# Patient Record
Sex: Female | Born: 1935
Health system: Southern US, Community
[De-identification: ages and names within clinical notes are randomized; demographics above are authoritative.]

## PROBLEM LIST (undated history)

## (undated) DIAGNOSIS — I10 Essential (primary) hypertension: Secondary | ICD-10-CM

## (undated) DIAGNOSIS — F32A Depression, unspecified: Secondary | ICD-10-CM

## (undated) DIAGNOSIS — K920 Hematemesis: Secondary | ICD-10-CM

## (undated) DIAGNOSIS — R609 Edema, unspecified: Secondary | ICD-10-CM

## (undated) DIAGNOSIS — E871 Hypo-osmolality and hyponatremia: Secondary | ICD-10-CM

## (undated) DIAGNOSIS — I499 Cardiac arrhythmia, unspecified: Secondary | ICD-10-CM

## (undated) DIAGNOSIS — F329 Major depressive disorder, single episode, unspecified: Secondary | ICD-10-CM

## (undated) DIAGNOSIS — T8859XA Other complications of anesthesia, initial encounter: Secondary | ICD-10-CM

## (undated) DIAGNOSIS — E039 Hypothyroidism, unspecified: Secondary | ICD-10-CM

## (undated) DIAGNOSIS — I82409 Acute embolism and thrombosis of unspecified deep veins of unspecified lower extremity: Secondary | ICD-10-CM

## (undated) DIAGNOSIS — M1990 Unspecified osteoarthritis, unspecified site: Secondary | ICD-10-CM

## (undated) DIAGNOSIS — F419 Anxiety disorder, unspecified: Secondary | ICD-10-CM

## (undated) DIAGNOSIS — K219 Gastro-esophageal reflux disease without esophagitis: Secondary | ICD-10-CM

## (undated) DIAGNOSIS — R002 Palpitations: Secondary | ICD-10-CM

## (undated) DIAGNOSIS — E785 Hyperlipidemia, unspecified: Secondary | ICD-10-CM

## (undated) DIAGNOSIS — R011 Cardiac murmur, unspecified: Secondary | ICD-10-CM

## (undated) DIAGNOSIS — N179 Acute kidney failure, unspecified: Secondary | ICD-10-CM

## (undated) DIAGNOSIS — I639 Cerebral infarction, unspecified: Secondary | ICD-10-CM

## (undated) DIAGNOSIS — T4145XA Adverse effect of unspecified anesthetic, initial encounter: Secondary | ICD-10-CM

## (undated) HISTORY — DX: Gastro-esophageal reflux disease without esophagitis: K21.9

## (undated) HISTORY — DX: Hypo-osmolality and hyponatremia: E87.1

## (undated) HISTORY — DX: Hematemesis: K92.0

## (undated) HISTORY — PX: CARDIAC CATHETERIZATION: SHX172

## (undated) HISTORY — DX: Acute embolism and thrombosis of unspecified deep veins of unspecified lower extremity: I82.409

## (undated) HISTORY — PX: CHOLECYSTECTOMY: SHX55

## (undated) HISTORY — DX: Hyperlipidemia, unspecified: E78.5

## (undated) HISTORY — PX: NECK SURGERY: SHX720

## (undated) HISTORY — PX: BELPHAROPTOSIS REPAIR: SHX369

## (undated) HISTORY — DX: Anxiety disorder, unspecified: F41.9

## (undated) HISTORY — DX: Acute kidney failure, unspecified: N17.9

## (undated) HISTORY — PX: TUBAL LIGATION: SHX77

## (undated) HISTORY — DX: Essential (primary) hypertension: I10

---

## 2002-07-03 HISTORY — PX: BREAST BIOPSY: SHX20

## 2004-06-14 ENCOUNTER — Ambulatory Visit: Payer: Self-pay | Admitting: Surgery

## 2005-06-29 ENCOUNTER — Ambulatory Visit: Payer: Self-pay | Admitting: Internal Medicine

## 2005-07-05 ENCOUNTER — Ambulatory Visit: Payer: Self-pay | Admitting: Gastroenterology

## 2006-07-09 ENCOUNTER — Ambulatory Visit: Payer: Self-pay | Admitting: Internal Medicine

## 2007-08-22 ENCOUNTER — Ambulatory Visit: Payer: Self-pay | Admitting: Internal Medicine

## 2007-09-05 ENCOUNTER — Ambulatory Visit: Payer: Self-pay | Admitting: Internal Medicine

## 2008-09-08 ENCOUNTER — Ambulatory Visit: Payer: Self-pay | Admitting: Internal Medicine

## 2008-11-30 ENCOUNTER — Ambulatory Visit: Payer: Self-pay | Admitting: Otolaryngology

## 2008-12-03 ENCOUNTER — Ambulatory Visit: Payer: Self-pay | Admitting: Otolaryngology

## 2009-01-07 ENCOUNTER — Ambulatory Visit: Payer: Self-pay | Admitting: Otolaryngology

## 2009-09-23 ENCOUNTER — Ambulatory Visit: Payer: Self-pay | Admitting: Internal Medicine

## 2010-09-29 ENCOUNTER — Ambulatory Visit: Payer: Self-pay | Admitting: Internal Medicine

## 2011-10-05 ENCOUNTER — Ambulatory Visit: Payer: Self-pay | Admitting: Internal Medicine

## 2012-04-10 ENCOUNTER — Ambulatory Visit: Payer: Self-pay | Admitting: Internal Medicine

## 2012-08-29 ENCOUNTER — Ambulatory Visit: Payer: Self-pay | Admitting: Emergency Medicine

## 2013-02-27 ENCOUNTER — Ambulatory Visit: Payer: Self-pay | Admitting: Internal Medicine

## 2014-03-12 ENCOUNTER — Ambulatory Visit: Payer: Self-pay | Admitting: Internal Medicine

## 2015-02-15 ENCOUNTER — Other Ambulatory Visit: Payer: Self-pay | Admitting: Internal Medicine

## 2015-02-15 DIAGNOSIS — Z1231 Encounter for screening mammogram for malignant neoplasm of breast: Secondary | ICD-10-CM

## 2015-03-15 ENCOUNTER — Ambulatory Visit
Admission: RE | Admit: 2015-03-15 | Discharge: 2015-03-15 | Disposition: A | Discharge status: Home / Self Care | Payer: Medicare Other | Source: Ambulatory Visit | Attending: Internal Medicine | Admitting: Internal Medicine

## 2015-03-15 ENCOUNTER — Other Ambulatory Visit: Payer: Self-pay | Admitting: Internal Medicine

## 2015-03-15 DIAGNOSIS — Z1231 Encounter for screening mammogram for malignant neoplasm of breast: Secondary | ICD-10-CM | POA: Insufficient documentation

## 2015-04-05 ENCOUNTER — Encounter: Payer: Self-pay | Admitting: *Deleted

## 2015-04-08 ENCOUNTER — Ambulatory Visit
Admission: RE | Admit: 2015-04-08 | Discharge: 2015-04-08 | Disposition: A | Discharge status: Home / Self Care | Payer: Medicare Other | Source: Ambulatory Visit | Attending: Ophthalmology | Admitting: Ophthalmology

## 2015-04-08 ENCOUNTER — Ambulatory Visit: Payer: Medicare Other | Admitting: Anesthesiology

## 2015-04-08 ENCOUNTER — Encounter
Admission: RE | Disposition: A | Discharge status: Home / Self Care | Payer: Self-pay | Source: Ambulatory Visit | Attending: Ophthalmology

## 2015-04-08 ENCOUNTER — Encounter: Payer: Self-pay | Admitting: *Deleted

## 2015-04-08 DIAGNOSIS — Z7982 Long term (current) use of aspirin: Secondary | ICD-10-CM | POA: Diagnosis not present

## 2015-04-08 DIAGNOSIS — E7800 Pure hypercholesterolemia, unspecified: Secondary | ICD-10-CM | POA: Diagnosis not present

## 2015-04-08 DIAGNOSIS — Z9049 Acquired absence of other specified parts of digestive tract: Secondary | ICD-10-CM | POA: Diagnosis not present

## 2015-04-08 DIAGNOSIS — M7989 Other specified soft tissue disorders: Secondary | ICD-10-CM | POA: Diagnosis not present

## 2015-04-08 DIAGNOSIS — I499 Cardiac arrhythmia, unspecified: Secondary | ICD-10-CM | POA: Diagnosis not present

## 2015-04-08 DIAGNOSIS — Z79899 Other long term (current) drug therapy: Secondary | ICD-10-CM | POA: Diagnosis not present

## 2015-04-08 DIAGNOSIS — R011 Cardiac murmur, unspecified: Secondary | ICD-10-CM | POA: Insufficient documentation

## 2015-04-08 DIAGNOSIS — F329 Major depressive disorder, single episode, unspecified: Secondary | ICD-10-CM | POA: Insufficient documentation

## 2015-04-08 DIAGNOSIS — I10 Essential (primary) hypertension: Secondary | ICD-10-CM | POA: Insufficient documentation

## 2015-04-08 DIAGNOSIS — E079 Disorder of thyroid, unspecified: Secondary | ICD-10-CM | POA: Insufficient documentation

## 2015-04-08 DIAGNOSIS — R002 Palpitations: Secondary | ICD-10-CM | POA: Diagnosis not present

## 2015-04-08 DIAGNOSIS — Z880 Allergy status to penicillin: Secondary | ICD-10-CM | POA: Insufficient documentation

## 2015-04-08 DIAGNOSIS — H2512 Age-related nuclear cataract, left eye: Secondary | ICD-10-CM | POA: Diagnosis present

## 2015-04-08 DIAGNOSIS — Z8673 Personal history of transient ischemic attack (TIA), and cerebral infarction without residual deficits: Secondary | ICD-10-CM | POA: Diagnosis not present

## 2015-04-08 HISTORY — DX: Depression, unspecified: F32.A

## 2015-04-08 HISTORY — DX: Cerebral infarction, unspecified: I63.9

## 2015-04-08 HISTORY — DX: Edema, unspecified: R60.9

## 2015-04-08 HISTORY — DX: Unspecified osteoarthritis, unspecified site: M19.90

## 2015-04-08 HISTORY — DX: Palpitations: R00.2

## 2015-04-08 HISTORY — DX: Major depressive disorder, single episode, unspecified: F32.9

## 2015-04-08 HISTORY — DX: Hypothyroidism, unspecified: E03.9

## 2015-04-08 HISTORY — PX: CATARACT EXTRACTION W/PHACO: SHX586

## 2015-04-08 HISTORY — DX: Cardiac arrhythmia, unspecified: I49.9

## 2015-04-08 HISTORY — DX: Other complications of anesthesia, initial encounter: T88.59XA

## 2015-04-08 HISTORY — DX: Cardiac murmur, unspecified: R01.1

## 2015-04-08 HISTORY — DX: Essential (primary) hypertension: I10

## 2015-04-08 HISTORY — DX: Adverse effect of unspecified anesthetic, initial encounter: T41.45XA

## 2015-04-08 SURGERY — PHACOEMULSIFICATION, CATARACT, WITH IOL INSERTION
Anesthesia: Monitor Anesthesia Care | Site: Eye | Laterality: Left | Wound class: Clean

## 2015-04-08 MED ORDER — ARMC OPHTHALMIC DILATING GEL
1.0000 "application " | OPHTHALMIC | Status: AC | PRN
  Administered 2015-04-08 (×2): 1 via OPHTHALMIC

## 2015-04-08 MED ORDER — MOXIFLOXACIN HCL 0.5 % OP SOLN: 1.0000 [drp] | OPHTHALMIC | Status: DC | PRN

## 2015-04-08 MED ORDER — MOXIFLOXACIN HCL 0.5 % OP SOLN
OPHTHALMIC | Status: DC | PRN
  Administered 2015-04-08: 8 [drp]

## 2015-04-08 MED ORDER — TETRACAINE HCL 0.5 % OP SOLN
1.0000 [drp] | OPHTHALMIC | Status: AC | PRN
  Administered 2015-04-08: 1 [drp] via OPHTHALMIC

## 2015-04-08 MED ORDER — POVIDONE-IODINE 5 % OP SOLN
1.0000 "application " | OPHTHALMIC | Status: AC | PRN
  Administered 2015-04-08: 1 via OPHTHALMIC

## 2015-04-08 MED ORDER — LACTATED RINGERS IV SOLN
INTRAVENOUS | Status: DC | PRN
  Administered 2015-04-08: 09:00:00 via INTRAVENOUS

## 2015-04-08 MED ORDER — EPINEPHRINE HCL 1 MG/ML IJ SOLN
INTRAMUSCULAR | Status: DC | PRN
  Administered 2015-04-08: 200 mL via OPHTHALMIC

## 2015-04-08 MED ORDER — NEOMYCIN-POLYMYXIN-DEXAMETH 0.1 % OP OINT
TOPICAL_OINTMENT | OPHTHALMIC | Status: DC | PRN
  Administered 2015-04-08: 1 via OPHTHALMIC

## 2015-04-08 MED ORDER — FENTANYL CITRATE (PF) 100 MCG/2ML IJ SOLN
INTRAMUSCULAR | Status: DC | PRN
  Administered 2015-04-08 (×2): 25 ug via INTRAVENOUS

## 2015-04-08 MED ORDER — CARBACHOL 0.01 % IO SOLN
INTRAOCULAR | Status: DC | PRN
  Administered 2015-04-08: 0.5 mL via INTRAOCULAR

## 2015-04-08 MED ORDER — NA HYALUR & NA CHOND-NA HYALUR 0.4-0.35 ML IO KIT
PACK | INTRAOCULAR | Status: DC | PRN
  Administered 2015-04-08: .35 mL via INTRAOCULAR

## 2015-04-08 MED ORDER — SODIUM CHLORIDE 0.9 % IV SOLN
INTRAVENOUS | Status: DC
  Administered 2015-04-08: 07:00:00 via INTRAVENOUS

## 2015-04-08 SURGICAL SUPPLY — 23 items
CANNULA ANT/CHMB 27GA (MISCELLANEOUS) ×3 IMPLANT
CARTRIDGE ABBOTT (MISCELLANEOUS) ×3 IMPLANT
CUP MEDICINE 2OZ PLAST GRAD ST (MISCELLANEOUS) ×3 IMPLANT
GLOVE BIO SURGEON STRL SZ8 (GLOVE) ×3 IMPLANT
GLOVE BIOGEL M 6.5 STRL (GLOVE) ×3 IMPLANT
GLOVE SURG LX 7.5 STRW (GLOVE) ×2
GLOVE SURG LX STRL 7.5 STRW (GLOVE) ×1 IMPLANT
GOWN STRL REUS W/ TWL LRG LVL3 (GOWN DISPOSABLE) ×2 IMPLANT
GOWN STRL REUS W/TWL LRG LVL3 (GOWN DISPOSABLE) ×4
LENS IOL TECNIS 22.0 (Intraocular Lens) ×3 IMPLANT
LENS IOL TECNIS MONO 1P 22.0 (Intraocular Lens) ×1 IMPLANT
PACK CATARACT (MISCELLANEOUS) ×3 IMPLANT
PACK CATARACT BRASINGTON LX (MISCELLANEOUS) ×3 IMPLANT
PACK EYE AFTER SURG (MISCELLANEOUS) ×3 IMPLANT
SOL BSS BAG (MISCELLANEOUS) ×3
SOL PREP PVP 2OZ (MISCELLANEOUS) ×3
SOLUTION BSS BAG (MISCELLANEOUS) ×1 IMPLANT
SOLUTION PREP PVP 2OZ (MISCELLANEOUS) ×1 IMPLANT
SYR 3ML LL SCALE MARK (SYRINGE) ×3 IMPLANT
SYR 5ML LL (SYRINGE) ×3 IMPLANT
SYR TB 1ML 27GX1/2 LL (SYRINGE) ×3 IMPLANT
WATER STERILE IRR 1000ML POUR (IV SOLUTION) ×3 IMPLANT
WIPE NON LINTING 3.25X3.25 (MISCELLANEOUS) ×3 IMPLANT

## 2015-04-08 NOTE — Anesthesia Postprocedure Evaluation (Signed)
  Anesthesia Post-op Note  Patient: Amy Roth  Procedure(s) Performed: Procedure(s) with comments: CATARACT EXTRACTION PHACO AND INTRAOCULAR LENS PLACEMENT (IOC) (Left) - US01:11.3 AP15.9 WKG88.11 FLUID LOT# [id number]  Anesthesia type:MAC  Patient location: PACU  Post pain: Pain level controlled  Post assessment: Post-op Vital signs reviewed, Patient's Cardiovascular Status Stable, Respiratory Function Stable, Patent Airway and No signs of Nausea or vomiting  Post vital signs: Reviewed and stable  Last Vitals:  Filed Vitals:   04/08/15 0638  Pulse: 55  Temp: 36.3 C  Resp: 14    Level of consciousness: awake, alert  and patient cooperative  Complications: No apparent anesthesia complications

## 2015-04-08 NOTE — Anesthesia Preprocedure Evaluation (Signed)
Anesthesia Evaluation  Patient identified by MRN, date of birth, ID band Patient awake    Reviewed: Allergy & Precautions, H&P , NPO status , Patient's Chart, lab work & pertinent test results, reviewed documented beta blocker date and time   History of Anesthesia Complications (+) history of anesthetic complications  Airway Mallampati: II  TM Distance: >3 FB Neck ROM: full    Dental no notable dental hx.    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 No LE sx  CVA, Residual Symptoms negative neurological ROS  negative psych ROS   GI/Hepatic negative GI ROS, Neg liver ROS,   Endo/Other  negative endocrine ROSHypothyroidism   Renal/GU negative Renal ROS  negative genitourinary   Musculoskeletal   Abdominal   Peds  Hematology negative hematology ROS (+)   Anesthesia Other Findings   Reproductive/Obstetrics negative OB ROS                             Anesthesia Physical Anesthesia Plan  ASA: III  Anesthesia Plan: MAC   Post-op Pain Management:    Induction:   Airway Management Planned:   Additional Equipment:   Intra-op Plan:   Post-operative Plan:   Informed Consent: I have reviewed the patients History and Physical, chart, labs and discussed the procedure including the risks, benefits and alternatives for the proposed anesthesia with the patient or authorized representative who has indicated his/her understanding and acceptance.   Dental Advisory Given  Plan Discussed with: CRNA  Anesthesia Plan Comments:         Anesthesia Quick Evaluation

## 2015-04-08 NOTE — Op Note (Signed)
LOCATION:  [place]  PREOPERATIVE DIAGNOSIS:  Nuclear sclerotic cataract left eye.  H25.12  POSTOPERATIVE DIAGNOSIS:  Nuclear sclerotic cataract left eye.   PROCEDURE:  Phacoemulsification with posterior chamber intraocular lens implantation of the left eye.   LENS:  Implant Name Type Inv. Item Serial No. Manufacturer Lot No. LRB No. Used  LENS IMPL INTRAOC ZCB00 22.0 - [id number] Intraocular Lens LENS IMPL INTRAOC ZCB00 22.0 [id number]   Left 1     ULTRASOUND TIME:  16 of 1 minutes 11 seconds, CDE 15.9 SURGEON:  Wyonia Hough, MD   ANESTHESIA:  Retrobulbar block of Xylocaine and Bupivacaine.   COMPLICATIONS:  None.   DESCRIPTION OF PROCEDURE:  The patient was identified in the holding room and transported to the operating room and placed in the supine position under the operating microscope.  The left eye was identified as the operative eye and a retrobulbar block was performed under intravenous sedation.  It was then prepped and draped in the usual sterile ophthalmic fashion.   A 1 millimeter clear-corneal paracentesis was made at the 1:30 position.  The anterior chamber was filled with Viscoat viscoelastic.  A 2.4 millimeter keratome was used to make a near-clear corneal incision at the 10:30 position.  A curvilinear capsulorrhexis was made with a cystotome and capsulorrhexis forceps.  Balanced salt solution was used to hydrodissect and hydrodelineate the nucleus.   Phacoemulsification was then used in stop and chop fashion to remove the lens nucleus and epinucleus.  The remaining cortex was then removed using the irrigation and aspiration handpiece.  Provisc was then placed into the capsular bag to distend it for lens placement.  A 22.0 -diopter lens was then injected into the capsular bag.  The remaining viscoelastic was aspirated.   Wounds were hydrated with balanced salt solution.  The anterior chamber was inflated to a physiologic pressure with balanced  salt solution.  No wound leaks were noted. Vigamox 0.2 ml of a 1mg  per ml solution was injected into the anterior chamber for a dose of 0.2 mg of intracameral antibiotic at the completion of the case.   Timolol and Brimonidine drops were placed followed by erythromycin ointment.  The eye was patched.  The patient was taken to the recovery room in stable condition without complications of anesthesia or surgery.   Amy Roth 04/08/2015, 9:08 AM

## 2015-04-08 NOTE — H&P (Signed)
  The History and Physical notes are on paper, have been signed, and are to be scanned. The patient remains stable and unchanged from the H&P.   Previous H&P reviewed, patient examined, and there are no changes.  Amy Roth 04/08/2015 8:42 AM

## 2015-04-08 NOTE — Transfer of Care (Deleted)
Immediate Anesthesia Transfer of Care Note  Patient: Amy Roth  Procedure(s) Performed: Procedure(s): CATARACT EXTRACTION PHACO AND INTRAOCULAR LENS PLACEMENT (IOC) (Left)  Patient Location: PACU  Anesthesia Type:MAC  Level of Consciousness: awake, alert  and oriented  Airway & Oxygen Therapy: Patient connected to nasal cannula oxygen  Post-op Assessment: Report given to RN and Post -op Vital signs reviewed and stable  Post vital signs: Reviewed and stable  Last Vitals:  Filed Vitals:   04/08/15 0638  Pulse: 55  Temp: 36.3 C  Resp: 14    Complications: No apparent anesthesia complications

## 2015-04-08 NOTE — Anesthesia Postprocedure Evaluation (Signed)
  Anesthesia Post-op Note  Patient: Amy Roth  Procedure(s) Performed: Procedure(s) with comments: CATARACT EXTRACTION PHACO AND INTRAOCULAR LENS PLACEMENT (IOC) (Left) - US01:11.3 AP15.9 JPE16.24 FLUID LOT# [id number]  Anesthesia type:MAC  Patient location: PACU  Post pain: Pain level controlled  Post assessment: Post-op Vital signs reviewed, Patient's Cardiovascular Status Stable, Respiratory Function Stable, Patent Airway and No signs of Nausea or vomiting  Post vital signs: Reviewed and stable  Last Vitals:  Filed Vitals:   04/08/15 0942  BP: 148/62  Pulse: 54  Temp: 35.7 C  Resp: 16    Level of consciousness: awake, alert  and patient cooperative  Complications: No apparent anesthesia complications

## 2015-04-08 NOTE — Discharge Instructions (Addendum)
Eye Surgery Discharge Instructions ? ? ? ?Expect mild scratchy sensation or mild soreness. ?DO NOT RUB YOUR EYE! ? ?The day of surgery: ?Minimal physical activity, but bed rest is not required ?No reading, computer work, or close hand work ?No bending, lifting, or straining. ?May watch TV ? ?For 24 hours: ?No driving, legal decisions, or alcoholic beverages ?Safety precautions ?Eat anything you prefer: It is better to start with liquids, then soup then solid foods. ?_____ Eye patch should be worn until postoperative exam tomorrow. ?__x__ Solar shield eyeglasses should be worn for comfort in the sunlight/patch while sleeping ? ?Resume all regular medications including aspirin or Coumadin if these were discontinued prior to surgery. ?You may shower, bathe, shave, or wash your hair. ?Tylenol may be taken for mild discomfort. ? ?Call your doctor if you experience significant pain, nausea, or vomiting, fever > 101 or other signs of infection. [phone] or [phone] ?Specific instructions: ? ?  ?

## 2015-04-08 NOTE — Transfer of Care (Signed)
Immediate Anesthesia Transfer of Care Note  Patient: Amy Roth  Procedure(s) Performed: Procedure(s) with comments: CATARACT EXTRACTION PHACO AND INTRAOCULAR LENS PLACEMENT (IOC) (Left) - US01:11.3 AP15.9 DCV01.31 FLUID LOT# [id number] H  Patient Location: PACU  Anesthesia Type:MAC  Level of Consciousness: awake, alert  and oriented  Airway & Oxygen Therapy: Patient Spontanous Breathing  Post-op Assessment: Report given to RN  Post vital signs: Reviewed and stable  Last Vitals:  Filed Vitals:   04/08/15 0638  Pulse: 55  Temp: 36.3 C  Resp: 14    Complications: No apparent anesthesia complications

## 2015-05-24 ENCOUNTER — Encounter: Payer: Self-pay | Admitting: *Deleted

## 2015-06-03 ENCOUNTER — Encounter
Admission: RE | Disposition: A | Discharge status: Home / Self Care | Payer: Self-pay | Source: Ambulatory Visit | Attending: Ophthalmology

## 2015-06-03 ENCOUNTER — Ambulatory Visit: Payer: Medicare Other | Admitting: Certified Registered Nurse Anesthetist

## 2015-06-03 ENCOUNTER — Encounter: Payer: Self-pay | Admitting: *Deleted

## 2015-06-03 ENCOUNTER — Ambulatory Visit
Admission: RE | Admit: 2015-06-03 | Discharge: 2015-06-03 | Disposition: A | Discharge status: Home / Self Care | Payer: Medicare Other | Source: Ambulatory Visit | Attending: Ophthalmology | Admitting: Ophthalmology

## 2015-06-03 DIAGNOSIS — E039 Hypothyroidism, unspecified: Secondary | ICD-10-CM | POA: Insufficient documentation

## 2015-06-03 DIAGNOSIS — H2511 Age-related nuclear cataract, right eye: Secondary | ICD-10-CM | POA: Diagnosis present

## 2015-06-03 DIAGNOSIS — I10 Essential (primary) hypertension: Secondary | ICD-10-CM | POA: Diagnosis not present

## 2015-06-03 DIAGNOSIS — Z79899 Other long term (current) drug therapy: Secondary | ICD-10-CM | POA: Insufficient documentation

## 2015-06-03 DIAGNOSIS — Z8673 Personal history of transient ischemic attack (TIA), and cerebral infarction without residual deficits: Secondary | ICD-10-CM | POA: Diagnosis not present

## 2015-06-03 DIAGNOSIS — Z7982 Long term (current) use of aspirin: Secondary | ICD-10-CM | POA: Insufficient documentation

## 2015-06-03 DIAGNOSIS — Z880 Allergy status to penicillin: Secondary | ICD-10-CM | POA: Diagnosis not present

## 2015-06-03 DIAGNOSIS — M1990 Unspecified osteoarthritis, unspecified site: Secondary | ICD-10-CM | POA: Insufficient documentation

## 2015-06-03 DIAGNOSIS — F329 Major depressive disorder, single episode, unspecified: Secondary | ICD-10-CM | POA: Insufficient documentation

## 2015-06-03 HISTORY — PX: CATARACT EXTRACTION W/PHACO: SHX586

## 2015-06-03 SURGERY — PHACOEMULSIFICATION, CATARACT, WITH IOL INSERTION
Anesthesia: Monitor Anesthesia Care | Laterality: Right | Wound class: Clean

## 2015-06-03 MED ORDER — EPINEPHRINE HCL 1 MG/ML IJ SOLN
INTRAOCULAR | Status: DC | PRN
  Administered 2015-06-03: 250 mL via OPHTHALMIC

## 2015-06-03 MED ORDER — NA HYALUR & NA CHOND-NA HYALUR 0.55-0.5 ML IO KIT
PACK | INTRAOCULAR | Status: AC
  Filled 2015-06-03: qty 1.05

## 2015-06-03 MED ORDER — TETRACAINE HCL 0.5 % OP SOLN
1.0000 [drp] | OPHTHALMIC | Status: AC | PRN
  Administered 2015-06-03: 1 [drp] via OPHTHALMIC

## 2015-06-03 MED ORDER — ARMC OPHTHALMIC DILATING GEL
1.0000 "application " | OPHTHALMIC | Status: AC | PRN
  Administered 2015-06-03 (×2): 1 via OPHTHALMIC

## 2015-06-03 MED ORDER — POVIDONE-IODINE 5 % OP SOLN
1.0000 "application " | OPHTHALMIC | Status: AC | PRN
  Administered 2015-06-03: 1 via OPHTHALMIC

## 2015-06-03 MED ORDER — NA HYALUR & NA CHOND-NA HYALUR 0.4-0.35 ML IO KIT
PACK | INTRAOCULAR | Status: DC | PRN
  Administered 2015-06-03: 1 mL via INTRAOCULAR

## 2015-06-03 MED ORDER — SODIUM CHLORIDE 0.9 % IV SOLN
INTRAVENOUS | Status: DC
  Administered 2015-06-03: 07:00:00 via INTRAVENOUS

## 2015-06-03 MED ORDER — CARBACHOL 0.01 % IO SOLN
INTRAOCULAR | Status: DC | PRN
Start: 2015-06-03 — End: 2015-06-03
  Administered 2015-06-03: .5 mL via INTRAOCULAR

## 2015-06-03 MED ORDER — MOXIFLOXACIN HCL 0.5 % OP SOLN: 1.0000 [drp] | OPHTHALMIC | Status: DC | PRN

## 2015-06-03 MED ORDER — EPINEPHRINE HCL 1 MG/ML IJ SOLN
INTRAMUSCULAR | Status: AC
  Filled 2015-06-03: qty 1

## 2015-06-03 MED ORDER — LIDOCAINE HCL (PF) 4 % IJ SOLN: INTRAOCULAR | Status: DC | PRN

## 2015-06-03 MED ORDER — NEOMYCIN-POLYMYXIN-DEXAMETH 3.5-10000-0.1 OP OINT
TOPICAL_OINTMENT | OPHTHALMIC | Status: AC
  Filled 2015-06-03: qty 3.5

## 2015-06-03 MED ORDER — CEFUROXIME OPHTHALMIC INJECTION 1 MG/0.1 ML: INJECTION | OPHTHALMIC | Status: DC | PRN

## 2015-06-03 MED ORDER — NEOMYCIN-POLYMYXIN-DEXAMETH 0.1 % OP OINT
TOPICAL_OINTMENT | OPHTHALMIC | Status: DC | PRN
  Administered 2015-06-03: 1 via OPHTHALMIC

## 2015-06-03 MED ORDER — CEFUROXIME OPHTHALMIC INJECTION 1 MG/0.1 ML
INJECTION | OPHTHALMIC | Status: AC
  Filled 2015-06-03: qty 0.1

## 2015-06-03 SURGICAL SUPPLY — 22 items
CANNULA ANT/CHMB 27GA (MISCELLANEOUS) ×3 IMPLANT
CUP MEDICINE 2OZ PLAST GRAD ST (MISCELLANEOUS) ×3 IMPLANT
GLOVE BIO SURGEON STRL SZ8 (GLOVE) ×3 IMPLANT
GLOVE BIOGEL M 6.5 STRL (GLOVE) ×3 IMPLANT
GLOVE SURG LX 7.5 STRW (GLOVE) ×2
GLOVE SURG LX STRL 7.5 STRW (GLOVE) ×1 IMPLANT
GOWN STRL REUS W/ TWL LRG LVL3 (GOWN DISPOSABLE) ×2 IMPLANT
GOWN STRL REUS W/TWL LRG LVL3 (GOWN DISPOSABLE) ×4
LENS IOL TECNIS 22.5 (Intraocular Lens) ×3 IMPLANT
LENS IOL TECNIS MONO 1P 22.5 (Intraocular Lens) ×1 IMPLANT
PACK CATARACT (MISCELLANEOUS) ×3 IMPLANT
PACK CATARACT BRASINGTON LX (MISCELLANEOUS) ×3 IMPLANT
PACK EYE AFTER SURG (MISCELLANEOUS) ×3 IMPLANT
SOL BSS BAG (MISCELLANEOUS) ×3
SOL PREP PVP 2OZ (MISCELLANEOUS) ×3
SOLUTION BSS BAG (MISCELLANEOUS) ×1 IMPLANT
SOLUTION PREP PVP 2OZ (MISCELLANEOUS) ×1 IMPLANT
SYR 3ML LL SCALE MARK (SYRINGE) ×3 IMPLANT
SYR 5ML LL (SYRINGE) ×3 IMPLANT
SYR TB 1ML 27GX1/2 LL (SYRINGE) ×3 IMPLANT
WATER STERILE IRR 1000ML POUR (IV SOLUTION) ×3 IMPLANT
WIPE NON LINTING 3.25X3.25 (MISCELLANEOUS) ×3 IMPLANT

## 2015-06-03 NOTE — Transfer of Care (Signed)
Immediate Anesthesia Transfer of Care Note  Patient: CHEALSEY STRAIN  Procedure(s) Performed: Procedure(s) with comments: CATARACT EXTRACTION PHACO AND INTRAOCULAR LENS PLACEMENT (IOC) (Right) - [place]  1:12.8 AP   19.3 CDE  13.23 casette lot [id number]  Patient Location: PACU  Anesthesia Type:MAC  Level of Consciousness: awake, alert  and oriented  Airway & Oxygen Therapy: spontaneous no oxygen  Post-op Assessment: Report given to RN  Post vital signs: Reviewed and stable  Last Vitals:  Filed Vitals:   06/03/15 0626 06/03/15 0757  BP: 160/71 118/85  Pulse: 54 74  Temp: 36.7 C   Resp: 16 18    Complications: No apparent anesthesia complications

## 2015-06-03 NOTE — Anesthesia Procedure Notes (Signed)
Procedure Name: MAC Date/Time: 06/03/2015 7:33 AM Performed by: Johnna Acosta Pre-anesthesia Checklist: Patient identified, Emergency Drugs available, Suction available and Patient being monitored Patient Re-evaluated:Patient Re-evaluated prior to inductionOxygen Delivery Method: Nasal cannula

## 2015-06-03 NOTE — Anesthesia Preprocedure Evaluation (Signed)
Anesthesia Evaluation  Patient identified by MRN, date of birth, ID band Patient awake    Reviewed: Allergy & Precautions, H&P , NPO status , Patient's Chart, lab work & pertinent test results, reviewed documented beta blocker date and time   History of Anesthesia Complications (+) history of anesthetic complications  Airway Mallampati: II  TM Distance: >3 FB Neck ROM: full    Dental no notable dental hx.    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 CVA, Residual Symptoms negative neurological ROS  negative psych ROS   GI/Hepatic negative GI ROS, Neg liver ROS,   Endo/Other  negative endocrine ROSHypothyroidism   Renal/GU negative Renal ROS  negative genitourinary   Musculoskeletal   Abdominal   Peds  Hematology negative hematology ROS (+)   Anesthesia Other Findings   Reproductive/Obstetrics negative OB ROS                             Anesthesia Physical Anesthesia Plan  ASA: III  Anesthesia Plan: MAC   Post-op Pain Management:    Induction:   Airway Management Planned:   Additional Equipment:   Intra-op Plan:   Post-operative Plan:   Informed Consent: I have reviewed the patients History and Physical, chart, labs and discussed the procedure including the risks, benefits and alternatives for the proposed anesthesia with the patient or authorized representative who has indicated his/her understanding and acceptance.   Dental Advisory Given  Plan Discussed with: CRNA  Anesthesia Plan Comments:         Anesthesia Quick Evaluation

## 2015-06-03 NOTE — Op Note (Signed)
OPERATIVE NOTE  Amy Roth [id number] 06/03/2015   PREOPERATIVE DIAGNOSIS:  Nuclear Sclerotic Cataract Right Eye H25.11   POSTOPERATIVE DIAGNOSIS: Nuclear Sclerotic Cataract Right Eye H25.11          PROCEDURE:  Phacoemusification with posterior chamber intraocular lens placement of the right eye   LENS:   Implant Name Type Inv. Item Serial No. Manufacturer Lot No. LRB No. Used  LENS IMPL INTRAOC ZCB00 22.5 - [id number] Intraocular Lens LENS IMPL INTRAOC ZCB00 22.5 [id number]   Right 1       ULTRASOUND TIME: 19 %  of 1 minutes 13 seconds, CDE 13.2  SURGEON:  Wyonia Hough, MD   ANESTHESIA:  Topical with tetracaine drops and 2% Xylocaine jelly.   COMPLICATIONS:  None.   DESCRIPTION OF PROCEDURE:  The patient was identified in the holding room and transported to the operating room and placed in the supine position under the operating microscope. Theright eye was identified as the operative eye and it was prepped and draped in the usual sterile ophthalmic fashion.   A 1 millimeter clear-corneal paracentesis was made at the 12:00 position.  The anterior chamber was filled with Viscoat viscoelastic.  A 2.4 millimeter keratome was used to make a near-clear corneal incision at the 9:00 position. A curvilinear capsulorrhexis was made with a cystotome and capsulorrhexis forceps.  Balanced salt solution was used to hydrodissect and hydrodelineate the nucleus.   Phacoemulsification was then used in stop and chop fashion to remove the lens nucleus and epinucleus.  The remaining cortex was then removed using the irrigation and aspiration handpiece. Provisc was then placed into the capsular bag to distend it for lens placement.  A lens was then injected into the capsular bag.  The remaining viscoelastic was aspirated.  Wounds were hydrated with balanced salt solution.  The anterior chamber was inflated to a physiologic pressure with balanced salt solution. Vigamox 0.2 ml of  a 1mg  per ml solution was injected into the anterior chamber for a dose of 0.2 mg of intracameral antibiotic at the completion of the case. Miostat was placed into the anterior chamber to constrict the pupil.  No wound leaks were noted.  Topical Vigamox drops and Maxitrol ointment were applied to the eye.  The patient was taken to the recovery room in stable condition without complications of anesthesia or surgery.  [name] 06/03/2015, 7:52 AM

## 2015-06-03 NOTE — H&P (Signed)
  The History and Physical notes are on paper, have been signed, and are to be scanned. The patient remains stable and unchanged from the H&P.   Previous H&P reviewed, patient examined, and there are no changes.  Amy Roth 06/03/2015 7:25 AM

## 2015-06-04 NOTE — Anesthesia Postprocedure Evaluation (Signed)
Anesthesia Post Note  Patient: Amy Roth  Procedure(s) Performed: Procedure(s) (LRB): CATARACT EXTRACTION PHACO AND INTRAOCULAR LENS PLACEMENT (IOC) (Right)  Patient location during evaluation: PACU Anesthesia Type: MAC Level of consciousness: awake and alert Pain management: pain level controlled Vital Signs Assessment: post-procedure vital signs reviewed and stable Respiratory status: spontaneous breathing, nonlabored ventilation, respiratory function stable and patient connected to nasal cannula oxygen Cardiovascular status: stable and blood pressure returned to baseline Anesthetic complications: no    Last Vitals:  Filed Vitals:   06/03/15 0801 06/03/15 0806  BP:  147/76  Pulse: 74 56  Temp: 35.9 C   Resp: 16     Last Pain:  Filed Vitals:   06/04/15 0837  PainSc: 0-No pain                 Molli Barrows

## 2016-04-18 ENCOUNTER — Other Ambulatory Visit: Payer: Self-pay | Admitting: Internal Medicine

## 2016-04-18 DIAGNOSIS — Z1231 Encounter for screening mammogram for malignant neoplasm of breast: Secondary | ICD-10-CM

## 2016-05-24 ENCOUNTER — Ambulatory Visit
Admission: RE | Admit: 2016-05-24 | Discharge: 2016-05-24 | Disposition: A | Discharge status: Home / Self Care | Payer: Medicare Other | Source: Ambulatory Visit | Attending: Internal Medicine | Admitting: Internal Medicine

## 2016-05-24 DIAGNOSIS — Z1231 Encounter for screening mammogram for malignant neoplasm of breast: Secondary | ICD-10-CM

## 2017-03-22 ENCOUNTER — Other Ambulatory Visit: Payer: Self-pay | Admitting: Internal Medicine

## 2017-03-22 DIAGNOSIS — Z1231 Encounter for screening mammogram for malignant neoplasm of breast: Secondary | ICD-10-CM

## 2017-05-01 ENCOUNTER — Emergency Department: Payer: Medicare Other

## 2017-05-01 ENCOUNTER — Emergency Department
Admission: EM | Admit: 2017-05-01 | Discharge: 2017-05-01 | Disposition: A | Discharge status: Home / Self Care | Payer: Medicare Other | Attending: Emergency Medicine | Admitting: Emergency Medicine

## 2017-05-01 ENCOUNTER — Encounter: Payer: Self-pay | Admitting: Emergency Medicine

## 2017-05-01 DIAGNOSIS — I10 Essential (primary) hypertension: Secondary | ICD-10-CM | POA: Insufficient documentation

## 2017-05-01 DIAGNOSIS — W19XXXA Unspecified fall, initial encounter: Secondary | ICD-10-CM | POA: Insufficient documentation

## 2017-05-01 DIAGNOSIS — Y929 Unspecified place or not applicable: Secondary | ICD-10-CM | POA: Insufficient documentation

## 2017-05-01 DIAGNOSIS — E039 Hypothyroidism, unspecified: Secondary | ICD-10-CM | POA: Diagnosis not present

## 2017-05-01 DIAGNOSIS — S0990XA Unspecified injury of head, initial encounter: Secondary | ICD-10-CM | POA: Diagnosis present

## 2017-05-01 DIAGNOSIS — Y939 Activity, unspecified: Secondary | ICD-10-CM | POA: Insufficient documentation

## 2017-05-01 DIAGNOSIS — Z7982 Long term (current) use of aspirin: Secondary | ICD-10-CM | POA: Diagnosis not present

## 2017-05-01 DIAGNOSIS — Y998 Other external cause status: Secondary | ICD-10-CM | POA: Diagnosis not present

## 2017-05-01 DIAGNOSIS — Z79899 Other long term (current) drug therapy: Secondary | ICD-10-CM | POA: Diagnosis not present

## 2017-05-01 DIAGNOSIS — M25561 Pain in right knee: Secondary | ICD-10-CM | POA: Insufficient documentation

## 2017-05-01 DIAGNOSIS — N390 Urinary tract infection, site not specified: Secondary | ICD-10-CM | POA: Diagnosis not present

## 2017-05-01 DIAGNOSIS — Z8673 Personal history of transient ischemic attack (TIA), and cerebral infarction without residual deficits: Secondary | ICD-10-CM | POA: Insufficient documentation

## 2017-05-01 LAB — URINALYSIS, COMPLETE (UACMP) WITH MICROSCOPIC
BACTERIA UA: NONE SEEN
Bilirubin Urine: NEGATIVE
Glucose, UA: NEGATIVE mg/dL
Hgb urine dipstick: NEGATIVE
KETONES UR: NEGATIVE mg/dL
NITRITE: NEGATIVE
PH: 7 (ref 5.0–8.0)
PROTEIN: NEGATIVE mg/dL
Specific Gravity, Urine: 1.011 (ref 1.005–1.030)

## 2017-05-01 LAB — BASIC METABOLIC PANEL
ANION GAP: 6 (ref 5–15)
BUN: 12 mg/dL (ref 6–20)
CO2: 27 mmol/L (ref 22–32)
Calcium: 9.6 mg/dL (ref 8.9–10.3)
Chloride: 104 mmol/L (ref 101–111)
Creatinine, Ser: 0.99 mg/dL (ref 0.44–1.00)
GFR calc Af Amer: >60 mL/min (ref >60)
GFR calc non Af Amer: 52 mL/min — ABNORMAL LOW (ref >60)
GLUCOSE: 127 mg/dL — AB (ref 65–99)
POTASSIUM: 4.2 mmol/L (ref 3.5–5.1)
Sodium: 137 mmol/L (ref 135–145)

## 2017-05-01 LAB — CBC
HEMATOCRIT: 42.9 % (ref 35.0–47.0)
HEMOGLOBIN: 14.7 g/dL (ref 12.0–16.0)
MCH: 31.3 pg (ref 26.0–34.0)
MCHC: 34.2 g/dL (ref 32.0–36.0)
MCV: 91.5 fL (ref 80.0–100.0)
Platelets: 231 10*3/uL (ref 150–440)
RBC: 4.69 MIL/uL (ref 3.80–5.20)
RDW: 14 % (ref 11.5–14.5)
WBC: 7.2 10*3/uL (ref 3.6–11.0)

## 2017-05-01 LAB — TROPONIN I

## 2017-05-01 MED ORDER — CEPHALEXIN 500 MG PO CAPS
500.0000 mg | ORAL_CAPSULE | Freq: Once | ORAL | Status: AC
  Administered 2017-05-01: 500 mg via ORAL
  Filled 2017-05-01: qty 1

## 2017-05-01 MED ORDER — CEPHALEXIN 500 MG PO CAPS: 500.0000 mg | ORAL_CAPSULE | Freq: Three times a day (TID) | ORAL | 0 refills | Status: DC

## 2017-05-01 NOTE — Discharge Instructions (Signed)
Please seek medical attention for any high fevers, chest pain, shortness of breath, change in behavior, persistent vomiting, bloody stool or any other new or concerning symptoms.  

## 2017-05-01 NOTE — ED Provider Notes (Signed)
[facility] Emergency Department Provider Note   ____________________________________________   I have reviewed the triage vital signs and the nursing notes.   HISTORY  Chief Complaint Weakness, fall, head injury  History limited by: Not Limited   HPI Amy Roth is a 83 y.o. female who presents to the emergency department today because of concern for weakness, fall and head injury.  DURATION:2 days TIMING: fell 2 days ago, somewhat constant weakness since then QUALITY: felt lightheaded CONTEXT: patient states she fell two days ago, has had some weakness since then. [place] again today. Hit her head both times. Unclear LOC.  ASSOCIATED SYMPTOMS: complaining of right knee pain. denies fever, denies shortness of breath. Denies chest pain.  Per medical record review patient has a history of arthritis  Past Medical History:  Diagnosis Date  . Arthritis   . Complication of anesthesia    hard to wake up  . Depression   . Dysrhythmia   . Edema    feet/legs  . Heart murmur   . Hypertension   . Hypothyroidism   . Palpitation   . Stroke [facility])    facial    There are no active problems to display for this patient.   Past Surgical History:  Procedure Laterality Date  . BELPHAROPTOSIS REPAIR    . BREAST BIOPSY Left 2004   benign  . CARDIAC CATHETERIZATION    . CATARACT EXTRACTION W/PHACO Left 04/08/2015   Procedure: CATARACT EXTRACTION PHACO AND INTRAOCULAR LENS PLACEMENT (IOC);  Surgeon: Leandrew Koyanagi, MD;  Location: [facility] ORS;  Service: Ophthalmology;  Laterality: Left;  US01:11.3 AP15.9 CDE11.33 FLUID LOT# [id number] H  . CATARACT EXTRACTION W/PHACO Right 06/03/2015   Procedure: CATARACT EXTRACTION PHACO AND INTRAOCULAR LENS PLACEMENT (IOC);  Surgeon: Leandrew Koyanagi, MD;  Location: [facility] ORS;  Service: Ophthalmology;  Laterality: Right;  [place]  1:12.8 AP   19.3 CDE  13.23 casette lot #[id number]  . CHOLECYSTECTOMY    . NECK SURGERY     . TUBAL LIGATION      Prior to Admission medications   Medication Sig Start Date End Date Taking? Authorizing Provider  amLODipine (NORVASC) 5 MG tablet Take 5 mg by mouth daily.    [provider]  aspirin 81 MG tablet Take 81 mg by mouth daily.    [provider]  benazepril (LOTENSIN) 20 MG tablet Take 20 mg by mouth daily.    [provider]  levothyroxine (SYNTHROID, LEVOTHROID) 50 MCG tablet Take 50 mcg by mouth daily before breakfast.    [provider]  metoprolol succinate (TOPROL-XL) 25 MG 24 hr tablet Take 25 mg by mouth daily.    [provider]  PARoxetine (PAXIL) 20 MG tablet Take 20 mg by mouth at bedtime.    [provider]  simvastatin (ZOCOR) 40 MG tablet Take 40 mg by mouth at bedtime.    [provider]    Allergies Penicillins  History reviewed. No pertinent family history.  Social History Social History  Substance Use Topics  . Smoking status: Never Smoker  . Smokeless tobacco: Never Used  . Alcohol use No    Review of Systems Constitutional: No fever/chills Eyes: No visual changes. ENT: No sore throat. Cardiovascular: Denies chest pain. Respiratory: Denies shortness of breath. Gastrointestinal: No abdominal pain.  No nausea, no vomiting.  No diarrhea.   Genitourinary: Negative for dysuria. Musculoskeletal: Positive for right knee pain. Skin: Negative for rash. Neurological: Negative for headaches, focal weakness or numbness.  ____________________________________________  PHYSICAL EXAM:  VITAL SIGNS: ED Triage Vitals  Enc Vitals Group     BP 05/01/17 1534 (!) 179/85     Pulse Rate 05/01/17 1532 78     Resp 05/01/17 1532 18     Temp 05/01/17 1532 97.8 F (36.6 C)     Temp Source 05/01/17 1532 Oral     SpO2 05/01/17 1532 100 %     Weight 05/01/17 1532 160 lb (72.6 kg)     Height 05/01/17 1532 5\' 1"  (1.549 m)     Head Circumference --      Peak Flow --      Pain Score  05/01/17 1531 0   Constitutional: Alert and oriented. Well appearing and in no distress. Eyes: Conjunctivae are normal.  ENT   Head: Normocephalic and atraumatic.   Nose: No congestion/rhinnorhea.   Mouth/Throat: Mucous membranes are moist.   Neck: No stridor. Hematological/Lymphatic/Immunilogical: No cervical lymphadenopathy. Cardiovascular: Normal rate, regular rhythm.  No murmurs, rubs, or gallops.  Respiratory: Normal respiratory effort without tachypnea nor retractions. Breath sounds are clear and equal bilaterally. No wheezes/rales/rhonchi. Gastrointestinal: Soft and non tender. No rebound. No guarding.  Genitourinary: Deferred Musculoskeletal: Normal range of motion in all extremities. No lower extremity edema. Neurologic:  Normal speech and language. No gross focal neurologic deficits are appreciated.  Skin:  Skin is warm, dry and intact. No rash noted. Psychiatric: Mood and affect are normal. Speech and behavior are normal. Patient exhibits appropriate insight and judgment.  ____________________________________________    LABS (pertinent positives/negatives)  BMP gl 127 k 4.2 CBC wnl UA with 6-30 WBCs, mod leukocytes Trop <0.03  ____________________________________________   EKG  I, Nance Pear, attending physician, personally viewed and interpreted this EKG  EKG Time: 1537 Rate: 79 Rhythm: normal sinus rhythm Axis: left axis devation Intervals: qtc 417 QRS: narrow, q waves V1, III ST changes: no st elevation Impression: abnormal ekg  ____________________________________________    RADIOLOGY  CT head No acute findings  ____________________________________________   PROCEDURES  Procedures  ____________________________________________   INITIAL IMPRESSION / ASSESSMENT AND PLAN / ED COURSE  Pertinent labs & imaging results that were available during my care of the patient were reviewed by me and considered in my medical decision  making (see chart for details).  She presents to the emergency department today with concerns for a week.  Differential would include electrolyte abnormality, infection, anemia amongst other etiologies.  Would also be concerned for head injury given the falls.  Head CT negative for acute process.  Patient's workup is concerning for urinary tract infection which could explain the weakness.  No acute fracture or traumatic injury to the right knee identified on x-ray.  Will plan on treating patient for urinary tract infection.  Discussed return precautions importance of primary care follow-up.   ____________________________________________   FINAL CLINICAL IMPRESSION(S) / ED DIAGNOSES  Final diagnoses:  Injury of head, initial encounter  Acute pain of right knee  Lower urinary tract infection     Note: This dictation was prepared with Dragon dictation. Any transcriptional errors that result from this process are unintentional     Nance Pear, MD 05/01/17 1801

## 2017-05-01 NOTE — ED Notes (Signed)
X-ray at bedside

## 2017-05-01 NOTE — ED Notes (Signed)
Pt has had two falls since Sunday. Reports hitting head twice. No LOC on fall today. Pain to right knee, mild bruising to right knee. Pt alert and oriented X4, active, cooperative, pt in NAD. RR even and unlabored, color WNL.

## 2017-05-01 NOTE — ED Notes (Signed)
Pt alert and oriented X4, active, cooperative, pt in NAD. RR even and unlabored, color WNL.  Discharge and followup instructions reviewed. Pt informed to return if any life threatening symptoms occur.   

## 2017-05-01 NOTE — ED Triage Notes (Signed)
Pt reports fell early Sunday morning walking back to bathroom; got tangled in pajamas.  Hit head but unsure if LOC. No blood thinners.  Today woke up feeling lightheaded and not well.  Tried to take a benadryl to feel better.  Took nap and then fell walking back from mailbox, unsure why fell.  Unsure LOC with fall today.

## 2017-05-02 LAB — URINE CULTURE

## 2017-05-29 ENCOUNTER — Ambulatory Visit
Admission: RE | Admit: 2017-05-29 | Discharge: 2017-05-29 | Disposition: A | Discharge status: Home / Self Care | Payer: Medicare Other | Source: Ambulatory Visit | Attending: Internal Medicine | Admitting: Internal Medicine

## 2017-05-29 DIAGNOSIS — Z1231 Encounter for screening mammogram for malignant neoplasm of breast: Secondary | ICD-10-CM | POA: Insufficient documentation

## 2018-01-06 ENCOUNTER — Other Ambulatory Visit: Payer: Self-pay

## 2018-01-06 ENCOUNTER — Emergency Department
Admission: EM | Admit: 2018-01-06 | Discharge: 2018-01-06 | Disposition: A | Discharge status: Home / Self Care | Payer: Medicare Other | Attending: Emergency Medicine | Admitting: Emergency Medicine

## 2018-01-06 ENCOUNTER — Emergency Department: Payer: Medicare Other

## 2018-01-06 DIAGNOSIS — Y939 Activity, unspecified: Secondary | ICD-10-CM | POA: Diagnosis not present

## 2018-01-06 DIAGNOSIS — W109XXA Fall (on) (from) unspecified stairs and steps, initial encounter: Secondary | ICD-10-CM | POA: Diagnosis not present

## 2018-01-06 DIAGNOSIS — Z79899 Other long term (current) drug therapy: Secondary | ICD-10-CM | POA: Insufficient documentation

## 2018-01-06 DIAGNOSIS — W19XXXA Unspecified fall, initial encounter: Secondary | ICD-10-CM

## 2018-01-06 DIAGNOSIS — E039 Hypothyroidism, unspecified: Secondary | ICD-10-CM | POA: Insufficient documentation

## 2018-01-06 DIAGNOSIS — L03116 Cellulitis of left lower limb: Secondary | ICD-10-CM | POA: Diagnosis not present

## 2018-01-06 DIAGNOSIS — Y929 Unspecified place or not applicable: Secondary | ICD-10-CM | POA: Insufficient documentation

## 2018-01-06 DIAGNOSIS — S8012XA Contusion of left lower leg, initial encounter: Secondary | ICD-10-CM | POA: Diagnosis not present

## 2018-01-06 DIAGNOSIS — T148XXA Other injury of unspecified body region, initial encounter: Secondary | ICD-10-CM

## 2018-01-06 DIAGNOSIS — Y999 Unspecified external cause status: Secondary | ICD-10-CM | POA: Diagnosis not present

## 2018-01-06 DIAGNOSIS — L0390 Cellulitis, unspecified: Secondary | ICD-10-CM

## 2018-01-06 DIAGNOSIS — I10 Essential (primary) hypertension: Secondary | ICD-10-CM | POA: Insufficient documentation

## 2018-01-06 DIAGNOSIS — M79605 Pain in left leg: Secondary | ICD-10-CM

## 2018-01-06 DIAGNOSIS — M79662 Pain in left lower leg: Secondary | ICD-10-CM | POA: Diagnosis present

## 2018-01-06 MED ORDER — DOXYCYCLINE HYCLATE 100 MG PO TABS
100.0000 mg | ORAL_TABLET | Freq: Once | ORAL | Status: AC
  Administered 2018-01-06: 100 mg via ORAL
  Filled 2018-01-06: qty 1

## 2018-01-06 MED ORDER — DOXYCYCLINE HYCLATE 100 MG PO CAPS: 100.0000 mg | ORAL_CAPSULE | Freq: Two times a day (BID) | ORAL | 0 refills | Status: AC

## 2018-01-06 NOTE — Discharge Instructions (Addendum)
Please seek medical attention for any high fevers, chest pain, shortness of breath, change in behavior, persistent vomiting, bloody stool or any other new or concerning symptoms.  

## 2018-01-06 NOTE — ED Notes (Signed)
Pt reports that she fell up some stairs on wed and injured her left leg. Pt alert and oriented x 4. Family at bedside

## 2018-01-06 NOTE — ED Provider Notes (Signed)
[facility] Emergency Department Provider Note  ____________________________________________   I have reviewed the triage vital signs and the nursing notes.   HISTORY  Chief Complaint Fall and Leg Pain   History limited by: Not Limited   HPI Amy Roth is a 84 y.o. female who presents to the emergency department today because of concerns for left leg pain swelling redness.  Symptoms started 4 days ago and the patient had a fall.  She scraped her leg against a concrete step.  Since then she has noticed some bruising.  She feels like the bruising has gotten worse and extended further down her leg.  She describes some itchiness and warmth to that area.  She is also noticed increasing swelling.  Patient is on low-dose aspirin and states she bruises easily.  Patient has been able to walk on the leg since then.  The patient denies any fevers, nausea or vomiting.   Per medical record review patient has a history of taking aspirin.   Past Medical History:  Diagnosis Date  . Arthritis   . Complication of anesthesia    hard to wake up  . Depression   . Dysrhythmia   . Edema    feet/legs  . Heart murmur   . Hypertension   . Hypothyroidism   . Palpitation   . Stroke [facility])    facial    There are no active problems to display for this patient.   Past Surgical History:  Procedure Laterality Date  . BELPHAROPTOSIS REPAIR    . BREAST BIOPSY Left 2004   benign  . CARDIAC CATHETERIZATION    . CATARACT EXTRACTION W/PHACO Left 04/08/2015   Procedure: CATARACT EXTRACTION PHACO AND INTRAOCULAR LENS PLACEMENT (IOC);  Surgeon: Leandrew Koyanagi, MD;  Location: [facility] ORS;  Service: Ophthalmology;  Laterality: Left;  US01:11.3 AP15.9 CDE11.33 FLUID LOT# [id number]  . CATARACT EXTRACTION W/PHACO Right 06/03/2015   Procedure: CATARACT EXTRACTION PHACO AND INTRAOCULAR LENS PLACEMENT (IOC);  Surgeon: Leandrew Koyanagi, MD;  Location: [facility] ORS;  Service:  Ophthalmology;  Laterality: Right;  [place]  1:12.8 AP   19.3 CDE  13.23 casette lot #[id number]  . CHOLECYSTECTOMY    . NECK SURGERY    . TUBAL LIGATION      Prior to Admission medications   Medication Sig Start Date End Date Taking? Authorizing Provider  amLODipine (NORVASC) 5 MG tablet Take 5 mg by mouth daily.    [provider]  aspirin 81 MG tablet Take 81 mg by mouth daily.    [provider]  benazepril (LOTENSIN) 20 MG tablet Take 20 mg by mouth daily.    [provider]  cephALEXin (KEFLEX) 500 MG capsule Take 1 capsule (500 mg total) by mouth 3 (three) times daily. 05/01/17   Nance Pear, MD  levothyroxine (SYNTHROID, LEVOTHROID) 50 MCG tablet Take 50 mcg by mouth daily before breakfast.    [provider]  metoprolol succinate (TOPROL-XL) 25 MG 24 hr tablet Take 25 mg by mouth daily.    [provider]  PARoxetine (PAXIL) 20 MG tablet Take 20 mg by mouth at bedtime.    [provider]  simvastatin (ZOCOR) 40 MG tablet Take 40 mg by mouth at bedtime.    [provider]    Allergies Penicillins  Family History  Problem Relation Age of Onset  . Breast cancer Neg Hx     Social History Social History   Tobacco Use  . Smoking status: Never Smoker  .  Smokeless tobacco: Never Used  Substance Use Topics  . Alcohol use: No  . Drug use: Not on file    Review of Systems Constitutional: No fever/chills Eyes: No visual changes. ENT: No sore throat. Cardiovascular: Denies chest pain. Respiratory: Denies shortness of breath. Gastrointestinal: No abdominal pain.  No nausea, no vomiting.  No diarrhea.   Genitourinary: Negative for dysuria. Musculoskeletal: Positive for left leg pain. Skin: Positive for bruising and redness to left lower leg.  Neurological: Negative for headaches, focal weakness or numbness.  ____________________________________________   PHYSICAL EXAM:  VITAL SIGNS: ED Triage Vitals  Enc  Vitals Group     BP 01/06/18 1538 (!) 148/75     Pulse Rate 01/06/18 1538 80     Resp 01/06/18 1538 17     Temp 01/06/18 1538 98.7 F (37.1 C)     Temp Source 01/06/18 1538 Oral     SpO2 01/06/18 1538 95 %     Weight 01/06/18 1539 180 lb (81.6 kg)     Height 01/06/18 1539 5\' 1"  (1.549 m)     Head Circumference --      Peak Flow --      Pain Score 01/06/18 1538 6   Constitutional: Alert and oriented.  Eyes: Conjunctivae are normal.  ENT      Head: Normocephalic and atraumatic.      Nose: No congestion/rhinnorhea.      Mouth/Throat: Mucous membranes are moist.      Neck: No stridor. Hematological/Lymphatic/Immunilogical: No cervical lymphadenopathy. Cardiovascular: Normal rate, regular rhythm.  No murmurs, rubs, or gallops.  Respiratory: Normal respiratory effort without tachypnea nor retractions. Breath sounds are clear and equal bilaterally. No wheezes/rales/rhonchi. Gastrointestinal: Soft and non tender. No rebound. No guarding.  Genitourinary: Deferred Musculoskeletal: Left lower leg with some swelling throughout. Diffuse and large area of ecchymosis over the shin and calf. DP 2+ Neurologic:  Normal speech and language. No gross focal neurologic deficits are appreciated.  Skin:  Large area of ecchymosis over left lower leg.  Psychiatric: Mood and affect are normal. Speech and behavior are normal. Patient exhibits appropriate insight and judgment.  ____________________________________________    LABS (pertinent positives/negatives)  None  ____________________________________________   EKG  None  ____________________________________________    RADIOLOGY  Left lower extremity doppler No evidence of dvt, concern for hematoma.   ____________________________________________   PROCEDURES  Procedures  ____________________________________________   INITIAL IMPRESSION / ASSESSMENT AND PLAN / ED COURSE  Pertinent labs & imaging results that were available  during my care of the patient were reviewed by me and considered in my medical decision making (see chart for details).   Patient presented to the emergency department today because of concerns for left leg swelling, bruising, redness and discomfort.  On exam patient has a large area of ecchymosis to the left lower leg as well as swelling.  It is warm.  Ultrasound was performed given patient's history of DVT and this did not show any blood clot.  At this point I think patient has a large hematoma and bruise over the left leg.  Given the warmth and some increased redness and discomfort to have some concerns for secondary infection.  Discussed this with patient family.  Will place on antibiotics.  ____________________________________________   FINAL CLINICAL IMPRESSION(S) / ED DIAGNOSES  Final diagnoses:  Fall, initial encounter  Left leg pain  Cellulitis, unspecified cellulitis site  Hematoma     Note: This dictation was prepared with Dragon dictation. Any transcriptional errors that  result from this process are unintentional     Nance Pear, MD 01/06/18 [phone]

## 2018-01-06 NOTE — ED Notes (Signed)
Placed ace wrap around pt leg.

## 2018-01-06 NOTE — ED Triage Notes (Signed)
Pt states she tripped and fell last Wednesday, states she had bruising to the front of the upper shin, states since the bruising is worse and painful up into the thigh area now. Pt states she has a hx of DVT in the past.

## 2018-03-18 ENCOUNTER — Other Ambulatory Visit: Payer: Self-pay | Admitting: Internal Medicine

## 2018-03-18 DIAGNOSIS — Z1231 Encounter for screening mammogram for malignant neoplasm of breast: Secondary | ICD-10-CM

## 2018-05-10 DIAGNOSIS — M1710 Unilateral primary osteoarthritis, unspecified knee: Secondary | ICD-10-CM | POA: Insufficient documentation

## 2018-05-10 DIAGNOSIS — E559 Vitamin D deficiency, unspecified: Secondary | ICD-10-CM | POA: Insufficient documentation

## 2018-05-10 DIAGNOSIS — F411 Generalized anxiety disorder: Secondary | ICD-10-CM | POA: Insufficient documentation

## 2018-05-10 DIAGNOSIS — M810 Age-related osteoporosis without current pathological fracture: Secondary | ICD-10-CM | POA: Insufficient documentation

## 2018-05-13 ENCOUNTER — Encounter: Payer: Self-pay | Admitting: Urology

## 2018-05-13 ENCOUNTER — Ambulatory Visit: Payer: Medicare Other | Admitting: Urology

## 2018-05-13 ENCOUNTER — Telehealth: Payer: Self-pay | Admitting: Urology

## 2018-05-13 VITALS — BP 126/80 | HR 66 | Ht 61.0 in | Wt 178.3 lb

## 2018-05-13 DIAGNOSIS — N952 Postmenopausal atrophic vaginitis: Secondary | ICD-10-CM | POA: Diagnosis not present

## 2018-05-13 DIAGNOSIS — N390 Urinary tract infection, site not specified: Secondary | ICD-10-CM

## 2018-05-13 LAB — BLADDER SCAN AMB NON-IMAGING

## 2018-05-13 MED ORDER — ESTROGENS, CONJUGATED 0.625 MG/GM VA CREA
TOPICAL_CREAM | VAGINAL | 12 refills | Status: DC
Start: 2018-05-13 — End: 2020-10-24

## 2018-05-13 NOTE — Telephone Encounter (Signed)
FYI  Pt didn't stop at check out, was unable to obtain med release for request.

## 2018-05-13 NOTE — Progress Notes (Deleted)
05/13/2018 2:51 PM   Amy Roth February 23, 1934 [id number]  Referring provider: Perrin Maltese, MD [name], [name] [address]  Chief Complaint  Patient presents with  . Recurrent UTI    HPI: Patient is a 84 -year-old Caucasian female who is referred to [place] by Dr. Lamonte Sakai for recurrent urinary tract infections.  Patient states that she has had *** urinary tract infections over the last year.  Reviewing her records,  she has had 2 documented positive urine cultures. Positive for E. coli that was resistant to ampicillin and positive for Klebsiella aerogenes which was resistant to Augmentin and cefuroxime on March 18, 2018 Positive for entero-coccus faecalis that was resistant to Cipro and Levaquin on April 09, 2018  Her symptoms with a urinary tract infection consist of   She denies/endorses dysuria, gross hematuria, suprapubic pain, back pain, abdominal pain or flank pain associated with UTI's.    She has not had any recent fevers, chills, nausea or vomiting associated with UTI's.   She does/does not have a history of nephrolithiasis, GU surgery or GU trauma. ***  She is/is not sexually active.  She has/has not noted a correlation with her urinary tract infections and sexual intercourse.  ***   She does/does not engage in anal sex. ***  She is/ is not having anal to vaginal sex.*** She is/is not voiding before and after sex. ***     She is/is not postmenopausal. ***  She admits to/denies constipation and/or diarrhea. ***  She does/does not use tampons.  She does/does not engage in good perineal hygiene. She does/does not take tub baths. ***  She has/does not have incontinence.  She is using incontinence pads. ***  She is having/ not having pain with bladder filling.  ***  She has/not had any recent imaging studies.  ***  She is drinking *** of water daily.      PMH: Past Medical History:  Diagnosis Date  . Anxiety   . Arthritis   .  Arthritis   . Complication of anesthesia    hard to wake up  . Depression   . DVT (deep venous thrombosis) ([place])   . Dysrhythmia   . Edema    feet/legs  . GERD (gastroesophageal reflux disease)   . Heart murmur   . Hyperlipidemia   . Hypertension   . Hypothyroidism   . Palpitation   . Stroke [facility])    facial    Surgical History: Past Surgical History:  Procedure Laterality Date  . BELPHAROPTOSIS REPAIR    . BREAST BIOPSY Left 2004   benign  . CARDIAC CATHETERIZATION    . CATARACT EXTRACTION W/PHACO Left 04/08/2015   Procedure: CATARACT EXTRACTION PHACO AND INTRAOCULAR LENS PLACEMENT (IOC);  Surgeon: Leandrew Koyanagi, MD;  Location: [facility] ORS;  Service: Ophthalmology;  Laterality: Left;  US01:11.3 AP15.9 CDE11.33 FLUID LOT# [id number]  . CATARACT EXTRACTION W/PHACO Right 06/03/2015   Procedure: CATARACT EXTRACTION PHACO AND INTRAOCULAR LENS PLACEMENT (IOC);  Surgeon: Leandrew Koyanagi, MD;  Location: [facility] ORS;  Service: Ophthalmology;  Laterality: Right;  [place]  1:12.8 AP   19.3 CDE  13.23 casette lot #[id number]  . CHOLECYSTECTOMY    . NECK SURGERY    . TUBAL LIGATION      Home Medications:  Allergies as of 05/13/2018      Reactions   Penicillins       Medication List        Accurate as of 05/13/18  2:51 PM. Always  use your most recent med list.          amLODipine 5 MG tablet Commonly known as:  NORVASC Take 5 mg by mouth daily.   aspirin 81 MG tablet Take 81 mg by mouth daily.   benazepril 20 MG tablet Commonly known as:  LOTENSIN Take 20 mg by mouth daily.   conjugated estrogens vaginal cream Commonly known as:  PREMARIN Apply 0.5mg (pea-sized amount)  just inside the vaginal introitus with a finger-tip on  Monday, Wednesday and Friday nights.   docusate sodium 100 MG capsule Commonly known as:  COLACE Take 100 mg by mouth 2 (two) times daily.   levothyroxine 75 MCG tablet Commonly known as:  SYNTHROID, LEVOTHROID   meloxicam 15 MG  tablet Commonly known as:  MOBIC Take 15 mg by mouth daily.   metoprolol succinate 25 MG 24 hr tablet Commonly known as:  TOPROL-XL Take 25 mg by mouth daily.   PARoxetine 20 MG tablet Commonly known as:  PAXIL Take 20 mg by mouth at bedtime.   simvastatin 20 MG tablet Commonly known as:  ZOCOR       Allergies:  Allergies  Allergen Reactions  . Penicillins     Family History: Family History  Problem Relation Age of Onset  . Breast cancer Neg Hx     Social History:  reports that she has never smoked. She has never used smokeless tobacco. She reports that she does not drink alcohol or use drugs.  ROS: UROLOGY Frequent Urination?: Yes Hard to postpone urination?: Yes Burning/pain with urination?: No Get up at night to urinate?: Yes Leakage of urine?: Yes Urine stream starts and stops?: Yes Trouble starting stream?: Yes Do you have to strain to urinate?: No Blood in urine?: No Urinary tract infection?: Yes Sexually transmitted disease?: No Injury to kidneys or bladder?: No Painful intercourse?: No Weak stream?: Yes Currently pregnant?: No Vaginal bleeding?: No Last menstrual period?: Hysterectomy  Gastrointestinal Nausea?: No Vomiting?: No Indigestion/heartburn?: Yes Diarrhea?: No Constipation?: No  Constitutional Fever: No Night sweats?: No Weight loss?: No Fatigue?: No  Skin Skin rash/lesions?: No Itching?: No  Eyes Blurred vision?: No Double vision?: No  Ears/Nose/Throat Sore throat?: No Sinus problems?: No  Hematologic/Lymphatic Swollen glands?: No Easy bruising?: Yes  Cardiovascular Leg swelling?: Yes Chest pain?: No  Respiratory Cough?: No Shortness of breath?: No  Endocrine Excessive thirst?: No  Musculoskeletal Back pain?: No Joint pain?: Yes  Neurological Headaches?: No Dizziness?: No  Psychologic Depression?: Yes Anxiety?: Yes  Physical Exam: BP 126/80 (BP Location: Left Arm, Patient Position: Sitting, Cuff  Size: Large)   Pulse 66   Ht 5' 1" (1.549 m)   Wt 178 lb 4.8 oz (80.9 kg)   BMI 33.69 kg/m   Constitutional:  Well nourished. Alert and oriented, No acute distress. HEENT: [place] AT, moist mucus membranes.  Trachea midline, no masses. Cardiovascular: No clubbing, cyanosis, or edema. Respiratory: Normal respiratory effort, no increased work of breathing. GI: Abdomen is soft, non tender, non distended, no abdominal masses. Liver and spleen not palpable.  No hernias appreciated.  Stool sample for occult testing is not indicated.   GU: No CVA tenderness.  No bladder fullness or masses.  Normal external genitalia, normal pubic hair distribution, no lesions.  Normal urethral meatus, no lesions, no prolapse, no discharge.   No urethral masses, tenderness and/or tenderness. No bladder fullness, tenderness or masses. Normal vagina mucosa, good estrogen effect, no discharge, no lesions, good pelvic support, no cystocele or rectocele   noted.  No cervical motion tenderness.  Uterus is freely mobile and non-fixed.  No adnexal/parametria masses or tenderness noted.  Anus and perineum are without rashes or lesions.   *** Skin: No rashes, bruises or suspicious lesions. Lymph: No cervical or inguinal adenopathy. Neurologic: Grossly intact, no focal deficits, moving all 4 extremities. Psychiatric: Normal mood and affect.  Laboratory Data: Lab Results  Component Value Date   WBC 7.2 05/01/2017   HGB 14.7 05/01/2017   HCT 42.9 05/01/2017   MCV 91.5 05/01/2017   PLT 231 05/01/2017    Lab Results  Component Value Date   CREATININE 0.99 05/01/2017    No results found for: PSA  No results found for: TESTOSTERONE  No results found for: HGBA1C  No results found for: TSH  No results found for: CHOL, HDL, CHOLHDL, VLDL, LDLCALC  No results found for: AST No results found for: ALT No components found for: ALKALINEPHOPHATASE No components found for: BILIRUBINTOTAL  No results found for:  ESTRADIOL  Urinalysis    Component Value Date/Time   COLORURINE YELLOW (A) 05/01/2017 1536   APPEARANCEUR CLEAR (A) 05/01/2017 1536   LABSPEC 1.011 05/01/2017 1536   PHURINE 7.0 05/01/2017 1536   GLUCOSEU NEGATIVE 05/01/2017 1536   HGBUR NEGATIVE 05/01/2017 1536   BILIRUBINUR NEGATIVE 05/01/2017 1536   KETONESUR NEGATIVE 05/01/2017 1536   PROTEINUR NEGATIVE 05/01/2017 1536   NITRITE NEGATIVE 05/01/2017 1536   LEUKOCYTESUR MODERATE (A) 05/01/2017 1536    I have reviewed the labs.   Pertinent Imaging: *** I have independently reviewed the films.    Assessment & Plan:  ***  1. rUTI's  - criteria for recurrent UTI has been met with 2 or more infections in 6 months or 3 or greater infections in one year ***  - patient is instructed to increase their water intake until the urine is pale yellow or clear (10 to 12 cups daily) ***  - patient is instructed to take probiotics (yogurt, oral pills or vaginal suppositories), take cranberry pills or drink the juice and Vitamin C 1,000 mg daily to acidify the urine ***  - if using tampons, she should remove them prior to urinating and change them often ***  - avoid soaking in tubs and wipe front to back after urinating ***  - benefit from core strengthening exercises has been seen.  We can refer to PT if they desire ***  - advised them to have CATH UA's for urinalysis and culture to prevent skin, vaginal and/or rectal contamination of the specimen  - reviewed symptoms of UTI (fevers, chills, gross hematuria, mental status changes, dysuria, suprapubic pain, back pain and/or sudden worsening of urinary symptoms) and advised not to have urine checked or be treated for UTI if not experiencing symptoms  - discussed antibiotic stewardship with the patient - explained the risk of increasing risk of antibiotic resistance with continuous exposure to antibiotics, renal failure, hypoglycemia, C. Diff infection, allergic reactions, etc.    2. Vaginal  atrophy I explained to the patient that when women go through menopause and her estrogen levels are severely diminished, the normal vaginal flora will change.  This is due to an increase of the vaginal canal's pH. Because of this, the vaginal canal may be colonized by bacteria from the rectum instead of the protective lactobacillus.  This, accompanied by the loss of the mucus barrier with vaginal atrophy, is a cause of recurrent urinary tract infections. In some studies, the use of vaginal estrogen cream has been   demonstrated to reduce  recurrent urinary tract infections to one a year.  Patient was given a sample of vaginal estrogen cream (Premarin vaginal cream) and instructed to apply 0.74m (pea-sized amount)  just inside the vaginal introitus with a finger-tip on Monday, Wednesday and Friday nights.  I explained to the patient that vaginally administered estrogen, which causes only a slight increase in the blood estrogen levels, have fewer contraindications and adverse systemic effects that oral HT. I have also given prescriptions for the Estrace cream and Premarin cream, so that the patient may carry them to the pharmacy to see which one of the branded creams would be most economical for her.  If she finds both medications cost prohibitive, she is instructed to call the office.  We can then call in a compounded vaginal estrogen cream for the patient that may be more affordable.   Explained to patient that it can take up to 8 months to see changes in the vaginal mucosa She will follow up in three months for an exam.                                                      Return in about 1 month (around 06/12/2018) for exam .  These notes generated with voice recognition software. I apologize for typographical errors.  SZara Council PA-C  [facility] [address] [place] [phone]

## 2018-05-13 NOTE — Progress Notes (Signed)
May 13, 2018 6:13 AM   LAYSA Roth Apr 27, 1934 [id number]  Referring provider: Perrin Maltese, MD [address] [place], [place] [address]  Chief Complaint  Patient presents with  . Recurrent UTI    HPI: Amy Roth is an 84 y.o. female who is referred to [place] by Dr. Lamonte Sakai presents today for management and evaluation of a UTI. The pt reports that she is doing well overall.   Reviewing her records, she has had 2 documented positive urine cultures. Positive for E.coli that was resistant to ampicillin and positive for Klebsiella aerogenes which was resistant to Augmentin and ceuroxime on 03/18/18. Positive for entero-coccus faecalis that was resistant to Cipro and Levaquin on 04/09/18.   The pt reports that her PCP confirmed that her UTI had cleared this morning and she did not pick up a third course of antibiotics, after previously completing two courses. The pt notes that she has had a UTI from September until learning that it was cleared today. She endorses vaginal itching and stinging as associated symptoms. She notes that the itching and burning were only present while urinating and denies any other symptoms. She denies any blood in the urine, denies any suprapubic pain, fevers, chills, nausea, vomiting, or flank pain. The pt denies being sexually active at this time, nor the previous few years. She also endorses recent constipation, denying diarrhea, and notes that this constipation began after beginning antibiotics. The pt takes showers, not baths. When she wipes, she wipes back to front.   The pt also endorses a concern of not fully emptying her bladder. She notes that she is going to the bathroom 4 times a night, but denies urinating frequently in the daytime. She notes that she has been urinating multiple times at night for a couple years. The pt notes that she has had some incontinence and endorses urgency during the daytime, and denies urgency in the night.   She notes that her stream has been slightly weak.    She also notes that she consumes two 16oz water bottles during the day, and continues dirnking water at night time because her mouth is dry. The pt denies drinking soda, tea, alcohol, energy drinks, or other beverages.  She denies kidney or bladder surgery. She notes that her son had kidney stones and pursued oblative therapy. The pt denies a hysterectomy as well.   Her PVR today is 0 mL.     PMH: Past Medical History:  Diagnosis Date  . Anxiety   . Arthritis   . Arthritis   . Complication of anesthesia    hard to wake up  . Depression   . DVT (deep venous thrombosis) ([place])   . Dysrhythmia   . Edema    feet/legs  . GERD (gastroesophageal reflux disease)   . Heart murmur   . Hyperlipidemia   . Hypertension   . Hypothyroidism   . Palpitation   . Stroke [facility])    facial    Surgical History: Past Surgical History:  Procedure Laterality Date  . BELPHAROPTOSIS REPAIR    . BREAST BIOPSY Left 2004   benign  . CARDIAC CATHETERIZATION    . CATARACT EXTRACTION W/PHACO Left 04/08/2015   Procedure: CATARACT EXTRACTION PHACO AND INTRAOCULAR LENS PLACEMENT (IOC);  Surgeon: Leandrew Koyanagi, MD;  Location: [facility] ORS;  Service: Ophthalmology;  Laterality: Left;  US01:11.3 AP15.9 CDE11.33 FLUID LOT# [id number]  . CATARACT EXTRACTION W/PHACO Right 06/03/2015   Procedure: CATARACT EXTRACTION PHACO AND INTRAOCULAR LENS  PLACEMENT (IOC);  Surgeon: Leandrew Koyanagi, MD;  Location: [facility] ORS;  Service: Ophthalmology;  Laterality: Right;  [place]  1:12.8 AP   19.3 CDE  13.23 casette lot #[id number]  . CHOLECYSTECTOMY    . NECK SURGERY    . TUBAL LIGATION      Home Medications:  Allergies as of 05/13/2018      Reactions   Penicillins       Medication List        Accurate as of 05/13/18 11:59 PM. Always use your most recent med list.          amLODipine 5 MG tablet Commonly known as:  NORVASC Take 5 mg by mouth daily.   aspirin  81 MG tablet Take 81 mg by mouth daily.   benazepril 20 MG tablet Commonly known as:  LOTENSIN Take 20 mg by mouth daily.   conjugated estrogens vaginal cream Commonly known as:  PREMARIN Apply 0.38m (pea-sized amount)  just inside the vaginal introitus with a finger-tip on  Monday, Wednesday and Friday nights.   docusate sodium 100 MG capsule Commonly known as:  COLACE Take 100 mg by mouth 2 (two) times daily.   levothyroxine 75 MCG tablet Commonly known as:  SYNTHROID, LEVOTHROID   meloxicam 15 MG tablet Commonly known as:  MOBIC Take 15 mg by mouth daily.   metoprolol succinate 25 MG 24 hr tablet Commonly known as:  TOPROL-XL Take 25 mg by mouth daily.   PARoxetine 20 MG tablet Commonly known as:  PAXIL Take 20 mg by mouth at bedtime.   simvastatin 20 MG tablet Commonly known as:  ZOCOR       Allergies:  Allergies  Allergen Reactions  . Penicillins     Family History: Family History  Problem Relation Age of Onset  . Breast cancer Neg Hx     Social History:  reports that she has never smoked. She has never used smokeless tobacco. She reports that she does not drink alcohol or use drugs.  ROS: UROLOGY Frequent Urination?: Yes Hard to postpone urination?: Yes Burning/pain with urination?: No Get up at night to urinate?: Yes Leakage of urine?: Yes Urine stream starts and stops?: Yes Trouble starting stream?: Yes Do you have to strain to urinate?: No Blood in urine?: No Urinary tract infection?: Yes Sexually transmitted disease?: No Injury to kidneys or bladder?: No Painful intercourse?: No Weak stream?: Yes Currently pregnant?: No Vaginal bleeding?: No Last menstrual period?: Hysterectomy  Gastrointestinal Nausea?: No Vomiting?: No Indigestion/heartburn?: Yes Diarrhea?: No Constipation?: No  Constitutional Fever: No Night sweats?: No Weight loss?: No Fatigue?: No  Skin Skin rash/lesions?: No Itching?: No  Eyes Blurred vision?:  No Double vision?: No  Ears/Nose/Throat Sore throat?: No Sinus problems?: No  Hematologic/Lymphatic Swollen glands?: No Easy bruising?: Yes  Cardiovascular Leg swelling?: Yes Chest pain?: No  Respiratory Cough?: No Shortness of breath?: No  Endocrine Excessive thirst?: No  Musculoskeletal Back pain?: No Joint pain?: Yes  Neurological Headaches?: No Dizziness?: No  Psychologic Depression?: Yes Anxiety?: Yes  Physical Exam: BP 126/80 (BP Location: Left Arm, Patient Position: Sitting, Cuff Size: Large)   Pulse 66   Ht _0  (1.549 m)   Wt 178 lb 4.8 oz (80.9 kg)   BMI 33.69 kg/m    Constitutional:  Alert and oriented, No acute distress. HEENT: [place] AT, moist mucus membranes.  Trachea midline, no masses. Cardiovascular: No clubbing, cyanosis, or edema. Respiratory: Normal respiratory effort, no increased work of breathing. GI: Abdomen is soft,  nontender, nondistended, no abdominal masses GU: No CVA tenderness. Atrophic external genitalia, sparse pubic hair distribution, no lesions.  Normal urethral meatus, no lesions, no prolapse, no discharge.   No urethral masses, tenderness and/or tenderness. No bladder fullness, tenderness or masses.  Pale vaginal mucosa, poor estrogen effect, no discharge, no lesions, poor pelvic support noted.  No cervical motion tenderness.  Uterus is freely mobile and non-fixed.  No adnexal/parametria masses or tenderness noted.  Anus and perineum are without rashes or lesions.   Telcum powder observed. Rectocele . Grade 1 cystocele.   Lymph: No cervical or inguinal lymphadenopathy. Skin: No rashes, bruises or suspicious lesions. Neurologic: Grossly intact, no focal deficits, moving all 4 extremities. Psychiatric: Normal mood and affect.  Laboratory Data: Lab Results  Component Value Date   WBC 7.2 05/01/2017   HGB 14.7 05/01/2017   HCT 42.9 05/01/2017   MCV 91.5 05/01/2017   PLT 231 05/01/2017    Lab Results  Component Value Date    CREATININE 0.99 05/01/2017    No results found for: PSA  No results found for: TESTOSTERONE  No results found for: HGBA1C  Urinalysis    Component Value Date/Time   COLORURINE YELLOW (A) 05/01/2017 1536   APPEARANCEUR CLEAR (A) 05/01/2017 1536   LABSPEC 1.011 05/01/2017 1536   PHURINE 7.0 05/01/2017 [place] 05/01/2017 [place] 05/01/2017 [place] 05/01/2017 [place] 05/01/2017 1536   PROTEINUR NEGATIVE 05/01/2017 1536   NITRITE NEGATIVE 05/01/2017 1536   LEUKOCYTESUR MODERATE (A) 05/01/2017 1536    Lab Results  Component Value Date   BACTERIA NONE SEEN 05/01/2017   Pertinent Imaging Results for XAN, SPARKMAN (MRN [id number]) as of 05/29/2018 06:14  Ref. Range 05/13/2018 14:59  Scan Result Unknown 48m   Assessment & Plan:    1. Recurrent UTI Criteria for recurrent UTI has been met with 2 or more infections in 6 months or 3 or greater infections in one year  Patient is instructed to increase their water intake until the urine is pale yellow or clear (10 to 12 cups daily)  Patient is instructed to take probiotics (yogurt, oral pills or vaginal suppositories), take cranberry pills or drink the juice and Vitamin C 1,000 mg daily to acidify the urine  Avoid soaking in tubs and wipe front to back after urinating                                           2. Vaginal atrophy -Discussed the findings of vaginal atrophy after physical examination and the likely factor this played in her recent UTI  -Advised MWF application of Premarin cream. Pt denies any history of breast cancer.  -Will see the pt back in one month   Return in about 1 month (around 06/12/2018) for exam .  SZara Council [facility] [place] [place] [phone] I, SBaldwin [place] am acting as a sEducation administratorfor SZara Council P.A.   I have reviewed the above  documentation for accuracy and completeness, and I agree with the above.    SZara Council PA-C

## 2018-05-13 NOTE — Patient Instructions (Addendum)
I have sent a prescriptions for a vaginal estrogen cream, Premarin.   If it's too expensive, please call the office at [phone] for an alternative.  You are given a sample of vaginal estrogen cream Premarin and instructed to apply 0.5mg  (pea-sized amount)  just inside the vaginal introitus with a finger-tip on Monday, Wednesday and Friday nights,

## 2018-06-03 ENCOUNTER — Ambulatory Visit
Admission: RE | Admit: 2018-06-03 | Discharge: 2018-06-03 | Disposition: A | Discharge status: Home / Self Care | Payer: Medicare Other | Source: Ambulatory Visit | Attending: Internal Medicine | Admitting: Internal Medicine

## 2018-06-03 DIAGNOSIS — Z1231 Encounter for screening mammogram for malignant neoplasm of breast: Secondary | ICD-10-CM

## 2018-06-12 ENCOUNTER — Ambulatory Visit: Payer: Medicare Other | Admitting: Urology

## 2018-06-12 ENCOUNTER — Encounter: Payer: Self-pay | Admitting: Urology

## 2018-06-12 VITALS — BP 177/92 | HR 64 | Ht 61.0 in | Wt 178.0 lb

## 2018-06-12 DIAGNOSIS — B373 Candidiasis of vulva and vagina: Secondary | ICD-10-CM | POA: Diagnosis not present

## 2018-06-12 DIAGNOSIS — N390 Urinary tract infection, site not specified: Secondary | ICD-10-CM

## 2018-06-12 DIAGNOSIS — N952 Postmenopausal atrophic vaginitis: Secondary | ICD-10-CM

## 2018-06-12 DIAGNOSIS — B3731 Acute candidiasis of vulva and vagina: Secondary | ICD-10-CM

## 2018-06-12 MED ORDER — MICONAZOLE NITRATE 200 MG VA SUPP: 200.0000 mg | Freq: Every day | VAGINAL | 0 refills | Status: DC

## 2018-06-12 NOTE — Progress Notes (Signed)
May 13, 2018 11:26 AM   Amy Roth 11-20-1933 [id number]  Referring provider: Perrin Maltese, MD [address] [place], [place] [address]  Chief Complaint  Patient presents with  . Follow-up    HPI: Patient is an 84 year old Caucasian female with rUTI's and vaginal atrophy who presents today for one month follow up.  Background history Amy Roth is an 84 y.o. female who is referred to [place] by Dr. Lamonte Sakai presents today for management and evaluation of a UTI. The pt reports that she is doing well overall.  Reviewing her records, she has had 2 documented positive urine cultures. Positive for E.coli that was resistant to ampicillin and positive for Klebsiella aerogenes which was resistant to Augmentin and ceuroxime on 03/18/18. Positive for entero-coccus faecalis that was resistant to Cipro and Levaquin on 04/09/18. The pt reports that her PCP confirmed that her UTI had cleared this morning and she did not pick up a third course of antibiotics, after previously completing two courses. The pt notes that she has had a UTI from September until learning that it was cleared today. She endorses vaginal itching and stinging as associated symptoms. She notes that the itching and burning were only present while urinating and denies any other symptoms. She denies any blood in the urine, denies any suprapubic pain, fevers, chills, nausea, vomiting, or flank pain. The pt denies being sexually active at this time, nor the previous few years. She also endorses recent constipation, denying diarrhea, and notes that this constipation began after beginning antibiotics. The pt takes showers, not baths. When she wipes, she wipes back to front.  The pt also endorses a concern of not fully emptying her bladder. She notes that she is going to the bathroom 4 times a night, but denies urinating frequently in the daytime. She notes that she has been urinating multiple times at night for a couple  years. The pt notes that she has had some incontinence and endorses urgency during the daytime, and denies urgency in the night.  She notes that her stream has been slightly weak.   She also notes that she consumes two 16oz water bottles during the day, and continues dirnking water at night time because her mouth is dry. The pt denies drinking soda, tea, alcohol, energy drinks, or other beverages.  She denies kidney or bladder surgery. She notes that her son had kidney stones and pursued oblative therapy. The pt denies a hysterectomy as well.   Her PVR today was 0 mL.    At her visit on May 13, 2018, we reviewed UTI prevention strategies and she is placed on vaginal estrogen cream to be applied 3 nights weekly.    She states that over the weekend, she started to have vaginal itching and burning.   She was seen at her PCP's office and she states they took a urine specimen, but they did not do a pelvic exam.  The PCP's office called yesterday and told her they had sent a medication to her pharmacy and she has not picked it up yet.    She had blood work performed at her doctor's office today and we asked her to pick up the note and any lab that were performed last week.  They gave her a sheet of paper with a list of her medications and had Monurol written on the paper.  The patient states that they were not able to give her an office note or lab results  from that visit.   PMH: Past Medical History:  Diagnosis Date  . Anxiety   . Arthritis   . Arthritis   . Complication of anesthesia    hard to wake up  . Depression   . DVT (deep venous thrombosis) ([place])   . Dysrhythmia   . Edema    feet/legs  . GERD (gastroesophageal reflux disease)   . Heart murmur   . Hyperlipidemia   . Hypertension   . Hypothyroidism   . Palpitation   . Stroke [facility])    facial    Surgical History: Past Surgical History:  Procedure Laterality Date  . BELPHAROPTOSIS REPAIR    . BREAST BIOPSY Left 2004   benign    . CARDIAC CATHETERIZATION    . CATARACT EXTRACTION W/PHACO Left 04/08/2015   Procedure: CATARACT EXTRACTION PHACO AND INTRAOCULAR LENS PLACEMENT (IOC);  Surgeon: Leandrew Koyanagi, MD;  Location: [facility] ORS;  Service: Ophthalmology;  Laterality: Left;  US01:11.3 AP15.9 CDE11.33 FLUID LOT# [id number]  . CATARACT EXTRACTION W/PHACO Right 06/03/2015   Procedure: CATARACT EXTRACTION PHACO AND INTRAOCULAR LENS PLACEMENT (IOC);  Surgeon: Leandrew Koyanagi, MD;  Location: [facility] ORS;  Service: Ophthalmology;  Laterality: Right;  [place]  1:12.8 AP   19.3 CDE  13.23 casette lot #[id number]  . CHOLECYSTECTOMY    . NECK SURGERY    . TUBAL LIGATION      Home Medications:  Allergies as of 06/12/2018      Reactions   Penicillins       Medication List        Accurate as of 06/12/18 11:26 AM. Always use your most recent med list.          amLODipine 5 MG tablet Commonly known as:  NORVASC Take 5 mg by mouth daily.   aspirin 81 MG tablet Take 81 mg by mouth daily.   benazepril 20 MG tablet Commonly known as:  LOTENSIN Take 20 mg by mouth daily.   conjugated estrogens vaginal cream Commonly known as:  PREMARIN Apply 0.56m (pea-sized amount)  just inside the vaginal introitus with a finger-tip on  Monday, Wednesday and Friday nights.   docusate sodium 100 MG capsule Commonly known as:  COLACE Take 100 mg by mouth 2 (two) times daily.   levothyroxine 75 MCG tablet Commonly known as:  SYNTHROID, LEVOTHROID   meloxicam 15 MG tablet Commonly known as:  MOBIC Take 15 mg by mouth daily.   metoprolol succinate 25 MG 24 hr tablet Commonly known as:  TOPROL-XL Take 25 mg by mouth daily.   miconazole 200 MG vaginal suppository Commonly known as:  MICOTIN Place 1 suppository (200 mg total) vaginally at bedtime.   PARoxetine 20 MG tablet Commonly known as:  PAXIL Take 20 mg by mouth at bedtime.   simvastatin 20 MG tablet Commonly known as:  ZOCOR       Allergies:  Allergies   Allergen Reactions  . Penicillins     Family History: Family History  Problem Relation Age of Onset  . Breast cancer Neg Hx     Social History:  reports that she has never smoked. She has never used smokeless tobacco. She reports that she does not drink alcohol or use drugs.  ROS: UROLOGY Frequent Urination?: No Hard to postpone urination?: No Burning/pain with urination?: No Get up at night to urinate?: No Leakage of urine?: No Urine stream starts and stops?: No Trouble starting stream?: No Do you have to strain to urinate?: No Blood in urine?: No Urinary  tract infection?: No Sexually transmitted disease?: No Injury to kidneys or bladder?: No Painful intercourse?: No Weak stream?: No Currently pregnant?: No Vaginal bleeding?: No Last menstrual period?: n  Gastrointestinal Nausea?: No Vomiting?: No Indigestion/heartburn?: No Diarrhea?: No Constipation?: No  Constitutional Fever: No Night sweats?: No Weight loss?: No Fatigue?: No  Skin Skin rash/lesions?: No Itching?: No  Eyes Blurred vision?: No Double vision?: No  Ears/Nose/Throat Sore throat?: No Sinus problems?: No  Hematologic/Lymphatic Swollen glands?: No Easy bruising?: No  Cardiovascular Leg swelling?: No Chest pain?: No  Respiratory Cough?: No Shortness of breath?: No  Endocrine Excessive thirst?: No  Musculoskeletal Back pain?: No Joint pain?: No  Neurological Headaches?: No Dizziness?: No  Psychologic Depression?: No Anxiety?: No  Physical Exam: BP (!) 177/92   Pulse 64   Ht _0  (1.549 m)   Wt 178 lb (80.7 kg)   BMI 33.63 kg/m   Constitutional:  Well nourished. Alert and oriented, No acute distress. HEENT: [place] AT, moist mucus membranes.  Trachea midline, no masses. Cardiovascular: No clubbing, cyanosis, or edema. Respiratory: Normal respiratory effort, no increased work of breathing. GI: Abdomen is soft, non tender, non distended, no abdominal masses. Liver  and spleen not palpable.  No hernias appreciated.  Stool sample for occult testing is not indicated.   GU: No CVA tenderness.  No bladder fullness or masses.  Atrophic external genitalia, sparse pubic hair distribution, no lesions.  Normal urethral meatus, no lesions, no prolapse, no discharge.   No urethral masses, tenderness and/or tenderness. No bladder fullness, tenderness or masses. Erythematous and dry vagina mucosa, poor estrogen effect, no discharge, no lesions, fair pelvic support, grade I  cystocele and no rectocele noted.  No cervical motion tenderness.  Uterus is freely mobile and non-fixed.  No adnexal/parametria masses or tenderness noted.  Anus and perineum are without rashes or lesions.    Skin: No rashes, bruises or suspicious lesions. Lymph: No cervical or inguinal adenopathy. Neurologic: Grossly intact, no focal deficits, moving all 4 extremities. Psychiatric: Normal mood and affect.   Laboratory Data: Lab Results  Component Value Date   WBC 7.2 05/01/2017   HGB 14.7 05/01/2017   HCT 42.9 05/01/2017   MCV 91.5 05/01/2017   PLT 231 05/01/2017    Lab Results  Component Value Date   CREATININE 0.99 05/01/2017    No results found for: PSA  No results found for: TESTOSTERONE  No results found for: HGBA1C  Urinalysis    Component Value Date/Time   COLORURINE YELLOW (A) 05/01/2017 1536   APPEARANCEUR CLEAR (A) 05/01/2017 1536   LABSPEC 1.011 05/01/2017 1536   PHURINE 7.0 05/01/2017 [place] 05/01/2017 [place] 05/01/2017 [place] 05/01/2017 [place] 05/01/2017 1536   PROTEINUR NEGATIVE 05/01/2017 1536   NITRITE NEGATIVE 05/01/2017 1536   LEUKOCYTESUR MODERATE (A) 05/01/2017 1536    Lab Results  Component Value Date   BACTERIA NONE SEEN 05/01/2017   Pertinent Imaging Results for KENNI, NEWTON (MRN [id number]) as of 05/29/2018 06:14  Ref. Range 05/13/2018 14:59  Scan Result Unknown  54m   Assessment & Plan:    1. Vaginal candidiasis -I instructed the patient not to pick up the monorail prescription as she does not have symptoms of a UTI but vaginal burning and itching which is more in line with for a possible yeast infection and/or vaginal atrophy -She will continue the vaginal estrogen cream 3 nights weekly -I have sent  a prescription for econazole vaginal suppositories, 200 mg nightly x3 days  2. History of recurrent UTI Criteria for recurrent UTI has been met with 2 or more infections in 6 months or 3 or greater infections in one year  Patient is instructed to increase their water intake until the urine is pale yellow or clear (10 to 12 cups daily)  Patient is instructed to take probiotics (yogurt, oral pills or vaginal suppositories), take cranberry pills or drink the juice and Vitamin C 1,000 mg daily to acidify the urine  Avoid soaking in tubs and wipe front to back after urinating  -We have requested records from her visit last week                                        3. Vaginal atrophy -continues the use of vagina estrogen cream three nights weekly   Return for pending PCP's records .  Zara Council, PA-C  [facility] [address] [place], [place] [address] [phone]

## 2018-06-17 ENCOUNTER — Telehealth: Payer: Self-pay | Admitting: Urology

## 2018-06-17 NOTE — Telephone Encounter (Signed)
Left message

## 2018-06-17 NOTE — Telephone Encounter (Signed)
Please let Amy Roth know that we have received her records from her PCP's office.  She did have a positive urine culture from 06/07/2018, but it is likely a colonized specimen as she was not having symptoms of an UTI at that time.  I would not take an antibiotic.    Is she still having the vaginal itching or burning?

## 2018-06-17 NOTE — Telephone Encounter (Signed)
Patient notified. She stated she wasn't having any symptoms

## 2019-05-26 ENCOUNTER — Other Ambulatory Visit: Payer: Self-pay | Admitting: Internal Medicine

## 2019-05-26 DIAGNOSIS — Z1231 Encounter for screening mammogram for malignant neoplasm of breast: Secondary | ICD-10-CM

## 2019-06-06 ENCOUNTER — Ambulatory Visit
Admission: RE | Admit: 2019-06-06 | Discharge: 2019-06-06 | Disposition: A | Discharge status: Home / Self Care | Payer: Medicare Other | Source: Ambulatory Visit | Attending: Internal Medicine | Admitting: Internal Medicine

## 2019-06-06 DIAGNOSIS — Z1231 Encounter for screening mammogram for malignant neoplasm of breast: Secondary | ICD-10-CM | POA: Insufficient documentation

## 2020-03-12 ENCOUNTER — Other Ambulatory Visit: Payer: Self-pay | Admitting: Internal Medicine

## 2020-03-12 DIAGNOSIS — Z1231 Encounter for screening mammogram for malignant neoplasm of breast: Secondary | ICD-10-CM

## 2020-06-11 ENCOUNTER — Ambulatory Visit
Admission: RE | Admit: 2020-06-11 | Discharge: 2020-06-11 | Disposition: A | Discharge status: Home / Self Care | Payer: Medicare Other | Source: Ambulatory Visit | Attending: Internal Medicine | Admitting: Internal Medicine

## 2020-06-11 ENCOUNTER — Other Ambulatory Visit: Payer: Self-pay

## 2020-06-11 DIAGNOSIS — Z1231 Encounter for screening mammogram for malignant neoplasm of breast: Secondary | ICD-10-CM | POA: Insufficient documentation

## 2020-10-08 DIAGNOSIS — E039 Hypothyroidism, unspecified: Secondary | ICD-10-CM | POA: Diagnosis not present

## 2020-10-08 DIAGNOSIS — E782 Mixed hyperlipidemia: Secondary | ICD-10-CM | POA: Diagnosis not present

## 2020-10-08 DIAGNOSIS — N390 Urinary tract infection, site not specified: Secondary | ICD-10-CM | POA: Diagnosis not present

## 2020-10-08 DIAGNOSIS — I10 Essential (primary) hypertension: Secondary | ICD-10-CM | POA: Diagnosis not present

## 2020-10-14 ENCOUNTER — Emergency Department
Admission: EM | Admit: 2020-10-14 | Discharge: 2020-10-14 | Disposition: A | Discharge status: Home / Self Care | Payer: Medicare Other | Attending: Emergency Medicine | Admitting: Emergency Medicine

## 2020-10-14 ENCOUNTER — Other Ambulatory Visit: Payer: Self-pay

## 2020-10-14 DIAGNOSIS — I10 Essential (primary) hypertension: Secondary | ICD-10-CM | POA: Diagnosis not present

## 2020-10-14 DIAGNOSIS — Z7982 Long term (current) use of aspirin: Secondary | ICD-10-CM | POA: Diagnosis not present

## 2020-10-14 DIAGNOSIS — Z79899 Other long term (current) drug therapy: Secondary | ICD-10-CM | POA: Diagnosis not present

## 2020-10-14 DIAGNOSIS — N3000 Acute cystitis without hematuria: Secondary | ICD-10-CM | POA: Diagnosis not present

## 2020-10-14 DIAGNOSIS — E039 Hypothyroidism, unspecified: Secondary | ICD-10-CM | POA: Diagnosis not present

## 2020-10-14 DIAGNOSIS — R5381 Other malaise: Secondary | ICD-10-CM | POA: Insufficient documentation

## 2020-10-14 DIAGNOSIS — R531 Weakness: Secondary | ICD-10-CM | POA: Diagnosis not present

## 2020-10-14 DIAGNOSIS — R350 Frequency of micturition: Secondary | ICD-10-CM | POA: Diagnosis present

## 2020-10-14 LAB — BASIC METABOLIC PANEL
Anion gap: 8 (ref 5–15)
BUN: 5 mg/dL — ABNORMAL LOW (ref 8–23)
CO2: 25 mmol/L (ref 22–32)
Calcium: 9.4 mg/dL (ref 8.9–10.3)
Chloride: 102 mmol/L (ref 98–111)
Creatinine, Ser: 0.92 mg/dL (ref 0.44–1.00)
GFR, Estimated: >60 mL/min (ref >60)
Glucose, Bld: 126 mg/dL — ABNORMAL HIGH (ref 70–99)
Potassium: 4.1 mmol/L (ref 3.5–5.1)
Sodium: 135 mmol/L (ref 135–145)

## 2020-10-14 LAB — CBC
HCT: 46.3 % — ABNORMAL HIGH (ref 36.0–46.0)
Hemoglobin: 15.9 g/dL — ABNORMAL HIGH (ref 12.0–15.0)
MCH: 30.5 pg (ref 26.0–34.0)
MCHC: 34.3 g/dL (ref 30.0–36.0)
MCV: 88.7 fL (ref 80.0–100.0)
Platelets: 223 10*3/uL (ref 150–400)
RBC: 5.22 MIL/uL — ABNORMAL HIGH (ref 3.87–5.11)
RDW: 12.7 % (ref 11.5–15.5)
WBC: 8.1 10*3/uL (ref 4.0–10.5)
nRBC: 0 % (ref 0.0–0.2)

## 2020-10-14 LAB — URINALYSIS, COMPLETE (UACMP) WITH MICROSCOPIC
Bilirubin Urine: NEGATIVE
Glucose, UA: NEGATIVE mg/dL
Ketones, ur: NEGATIVE mg/dL
Nitrite: NEGATIVE
Protein, ur: NEGATIVE mg/dL
Specific Gravity, Urine: 1.001 — ABNORMAL LOW (ref 1.005–1.030)
WBC, UA: >50 WBC/hpf — ABNORMAL HIGH (ref 0–5)
pH: 6 (ref 5.0–8.0)

## 2020-10-14 MED ORDER — SODIUM CHLORIDE 0.9 % IV SOLN
1.0000 g | Freq: Once | INTRAVENOUS | Status: AC
  Administered 2020-10-14: 1 g via INTRAVENOUS
  Filled 2020-10-14: qty 10

## 2020-10-14 MED ORDER — CEPHALEXIN 500 MG PO CAPS: 500.0000 mg | ORAL_CAPSULE | Freq: Two times a day (BID) | ORAL | 0 refills | Status: AC

## 2020-10-14 MED ORDER — LACTATED RINGERS IV BOLUS
1000.0000 mL | Freq: Once | INTRAVENOUS | Status: AC
  Administered 2020-10-14: 1000 mL via INTRAVENOUS

## 2020-10-14 NOTE — ED Triage Notes (Signed)
Pt to ER via POV. States she has been generally feeling bad for 2 weeks. Went to see her PCP and was told she was dehydrated. Denies fevers, NVD.

## 2020-10-14 NOTE — ED Notes (Signed)
Patient ambulatory to daughters POV for discharge. Gait steady.

## 2020-10-14 NOTE — ED Provider Notes (Incomplete)
[facility] Emergency Department Provider Note   ____________________________________________   Event Date/Time   First MD Initiated Contact with Patient 10/14/20 1953     (approximate)  I have reviewed the triage vital signs and the nursing notes.   HISTORY  Chief Complaint Weakness    HPI Amy Roth is a 87 y.o. female with past medical history of hypertension, hyperlipidemia        Past Medical History:  Diagnosis Date  . Anxiety   . Arthritis   . Arthritis   . Complication of anesthesia    hard to wake up  . Depression   . DVT (deep venous thrombosis) ([place])   . Dysrhythmia   . Edema    feet/legs  . GERD (gastroesophageal reflux disease)   . Heart murmur   . Hyperlipidemia   . Hypertension   . Hypothyroidism   . Palpitation   . Stroke [facility])    facial    There are no problems to display for this patient.   Past Surgical History:  Procedure Laterality Date  . BELPHAROPTOSIS REPAIR    . BREAST BIOPSY Left 2004   benign  . CARDIAC CATHETERIZATION    . CATARACT EXTRACTION W/PHACO Left 04/08/2015   Procedure: CATARACT EXTRACTION PHACO AND INTRAOCULAR LENS PLACEMENT (IOC);  Surgeon: Leandrew Koyanagi, MD;  Location: [facility] ORS;  Service: Ophthalmology;  Laterality: Left;  US01:11.3 AP15.9 CDE11.33 FLUID LOT# [id number]  . CATARACT EXTRACTION W/PHACO Right 06/03/2015   Procedure: CATARACT EXTRACTION PHACO AND INTRAOCULAR LENS PLACEMENT (IOC);  Surgeon: Leandrew Koyanagi, MD;  Location: [facility] ORS;  Service: Ophthalmology;  Laterality: Right;  [place]  1:12.8 AP   19.3 CDE  13.23 casette lot #[id number]  . CHOLECYSTECTOMY    . NECK SURGERY    . TUBAL LIGATION      Prior to Admission medications   Medication Sig Start Date End Date Taking? Authorizing Provider  amLODipine (NORVASC) 5 MG tablet Take 5 mg by mouth daily.    [provider]  aspirin 81 MG tablet Take 81 mg by mouth daily.    [provider]   benazepril (LOTENSIN) 20 MG tablet Take 20 mg by mouth daily.    [provider]  conjugated estrogens (PREMARIN) vaginal cream Apply 0.5mg  (pea-sized amount)  just inside the vaginal introitus with a finger-tip on  Monday, Wednesday and Friday nights. 05/13/18   Zara Council A, PA-C  docusate sodium (COLACE) 100 MG capsule Take 100 mg by mouth 2 (two) times daily.    [provider]  levothyroxine (SYNTHROID, LEVOTHROID) 75 MCG tablet  04/03/18   [provider]  meloxicam (MOBIC) 15 MG tablet Take 15 mg by mouth daily.    [provider]  metoprolol succinate (TOPROL-XL) 25 MG 24 hr tablet Take 25 mg by mouth daily.    [provider]  miconazole (MICONAZOLE 3) 200 MG vaginal suppository Place 1 suppository (200 mg total) vaginally at bedtime. 06/12/18   Zara Council A, PA-C  PARoxetine (PAXIL) 20 MG tablet Take 20 mg by mouth at bedtime.    [provider]  simvastatin (ZOCOR) 20 MG tablet  04/03/18   [provider]    Allergies Penicillins  Family History  Problem Relation Age of Onset  . Breast cancer Neg Hx     Social History Social History   Tobacco Use  . Smoking status: Never Smoker  . Smokeless tobacco: Never Used  Vaping Use  . Vaping Use: Never used  Substance Use Topics  . Alcohol use: No  . Drug use: Never    Review of Systems *** Constitutional: No fever/chills Eyes: No visual changes. ENT: No sore throat. Cardiovascular: Denies chest pain. Respiratory: Denies shortness of breath. Gastrointestinal: No abdominal pain.  No nausea, no vomiting.  No diarrhea.  No constipation. Genitourinary: Negative for dysuria. Musculoskeletal: Negative for back pain. Skin: Negative for rash. Neurological: Negative for headaches, focal weakness or numbness.  ____________________________________________   PHYSICAL EXAM:  VITAL SIGNS: ED Triage Vitals  Enc Vitals Group     BP 10/14/20 1753 (!)  163/91     Pulse Rate 10/14/20 1752 80     Resp 10/14/20 1752 16     Temp 10/14/20 1752 98.4 F (36.9 C)     Temp Source 10/14/20 1752 Oral     SpO2 10/14/20 1752 95 %     Weight 10/14/20 1749 170 lb (77.1 kg)     Height 10/14/20 1749 5\' 1"  (1.549 m)     Head Circumference --      Peak Flow --      Pain Score 10/14/20 1749 0     Pain Loc --      Pain Edu? --      Excl. in [place]? --    *** Constitutional: Alert and oriented. Eyes: Conjunctivae are normal. Head: Atraumatic. Nose: No congestion/rhinnorhea. Mouth/Throat: Mucous membranes are moist. Neck: Normal ROM Cardiovascular: Normal rate, regular rhythm. Grossly normal heart sounds. Respiratory: Normal respiratory effort.  No retractions. Lungs CTAB. Gastrointestinal: Soft and nontender. No distention. Genitourinary: deferred Musculoskeletal: No lower extremity tenderness nor edema. Neurologic:  Normal speech and language. No gross focal neurologic deficits are appreciated. Skin:  Skin is warm, dry and intact. No rash noted. Psychiatric: Mood and affect are normal. Speech and behavior are normal.  ____________________________________________   LABS (all labs ordered are listed, but only abnormal results are displayed)  Labs Reviewed  BASIC METABOLIC PANEL - Abnormal; Notable for the following components:      Result Value   Glucose, Bld 126 (*)    BUN 5 (*)    All other components within normal limits  CBC - Abnormal; Notable for the following components:   RBC 5.22 (*)    Hemoglobin 15.9 (*)    HCT 46.3 (*)    All other components within normal limits  URINALYSIS, COMPLETE (UACMP) WITH MICROSCOPIC - Abnormal; Notable for the following components:   Color, Urine STRAW (*)    APPearance HAZY (*)    Specific Gravity, Urine 1.001 (*)    Hgb urine dipstick SMALL (*)    Leukocytes,Ua LARGE (*)    WBC, UA >50 (*)    Bacteria, UA RARE (*)    Non Squamous Epithelial PRESENT (*)    All other components within normal  limits  CBG MONITORING, ED   ____________________________________________  EKG  ED ECG REPORT I, Blake Divine, the attending physician, personally viewed and interpreted this ECG.   Date: 10/14/2020  EKG Time: 17:57  Rate: 74  Rhythm: normal sinus rhythm  Axis: Normal  Intervals:none  ST&T Change: None   PROCEDURES  Procedure(s) performed (including Critical Care):  Procedures   ____________________________________________   INITIAL IMPRESSION / ASSESSMENT AND PLAN / ED COURSE        ***      ____________________________________________   FINAL CLINICAL IMPRESSION(S) / ED DIAGNOSES  Final diagnoses:  None     ED Discharge Orders    None  Note:  This document was prepared using Dragon voice recognition software and may include unintentional dictation errors. 

## 2020-10-14 NOTE — ED Notes (Signed)
Daughter requesting IV fluids for her mother, "She was sent here because she's dehydrated."

## 2020-10-14 NOTE — ED Notes (Signed)
Patient is resting comfortably. 

## 2020-10-14 NOTE — ED Notes (Signed)
ED Provider at bedside. 

## 2020-10-14 NOTE — ED Provider Notes (Signed)
[facility] Emergency Department Provider Note   ____________________________________________   Event Date/Time   First MD Initiated Contact with Patient 10/14/20 1953     (approximate)  I have reviewed the triage vital signs and the nursing notes.   HISTORY  Chief Complaint Weakness    HPI Amy Roth is a 87 y.o. female with past medical history of hypertension, hyperlipidemia, DVT, and stroke presents to the ED complaining of weakness.  Patient reports that she has been feeling generally weak ever since being diagnosed with a UTI couple of weeks ago.  She completed a course of nitrofurantoin but has continued to feel weak and malaise.  She denies any focal numbness or weakness, does endorse urinary frequency, but denies dysuria.  She has not had any fevers, flank pain, abdominal pain, nausea, or vomiting.        Past Medical History:  Diagnosis Date  . Anxiety   . Arthritis   . Arthritis   . Complication of anesthesia    hard to wake up  . Depression   . DVT (deep venous thrombosis) ([place])   . Dysrhythmia   . Edema    feet/legs  . GERD (gastroesophageal reflux disease)   . Heart murmur   . Hyperlipidemia   . Hypertension   . Hypothyroidism   . Palpitation   . Stroke [facility])    facial    There are no problems to display for this patient.   Past Surgical History:  Procedure Laterality Date  . BELPHAROPTOSIS REPAIR    . BREAST BIOPSY Left 2004   benign  . CARDIAC CATHETERIZATION    . CATARACT EXTRACTION W/PHACO Left 04/08/2015   Procedure: CATARACT EXTRACTION PHACO AND INTRAOCULAR LENS PLACEMENT (IOC);  Surgeon: Leandrew Koyanagi, MD;  Location: [facility] ORS;  Service: Ophthalmology;  Laterality: Left;  US01:11.3 AP15.9 CDE11.33 FLUID LOT# [id number]  . CATARACT EXTRACTION W/PHACO Right 06/03/2015   Procedure: CATARACT EXTRACTION PHACO AND INTRAOCULAR LENS PLACEMENT (IOC);  Surgeon: Leandrew Koyanagi, MD;  Location: [facility] ORS;   Service: Ophthalmology;  Laterality: Right;  [place]  1:12.8 AP   19.3 CDE  13.23 casette lot #[id number]  . CHOLECYSTECTOMY    . NECK SURGERY    . TUBAL LIGATION      Prior to Admission medications   Medication Sig Start Date End Date Taking? Authorizing Provider  cephALEXin (KEFLEX) 500 MG capsule Take 1 capsule (500 mg total) by mouth 2 (two) times daily for 7 days. 10/14/20 10/21/20 Yes Blake Divine, MD  amLODipine (NORVASC) 5 MG tablet Take 5 mg by mouth daily.    [provider]  aspirin 81 MG tablet Take 81 mg by mouth daily.    [provider]  benazepril (LOTENSIN) 20 MG tablet Take 20 mg by mouth daily.    [provider]  conjugated estrogens (PREMARIN) vaginal cream Apply 0.5mg  (pea-sized amount)  just inside the vaginal introitus with a finger-tip on  Monday, Wednesday and Friday nights. 05/13/18   Zara Council A, PA-C  docusate sodium (COLACE) 100 MG capsule Take 100 mg by mouth 2 (two) times daily.    [provider]  levothyroxine (SYNTHROID, LEVOTHROID) 75 MCG tablet  04/03/18   [provider]  meloxicam (MOBIC) 15 MG tablet Take 15 mg by mouth daily.    [provider]  metoprolol succinate (TOPROL-XL) 25 MG 24 hr tablet Take 25 mg by mouth daily.    [provider]  miconazole (MICONAZOLE 3) 200 MG vaginal  suppository Place 1 suppository (200 mg total) vaginally at bedtime. 06/12/18   Zara Council A, PA-C  PARoxetine (PAXIL) 20 MG tablet Take 20 mg by mouth at bedtime.    [provider]  simvastatin (ZOCOR) 20 MG tablet  04/03/18   [provider]    Allergies Penicillins  Family History  Problem Relation Age of Onset  . Breast cancer Neg Hx     Social History Social History   Tobacco Use  . Smoking status: Never Smoker  . Smokeless tobacco: Never Used  Vaping Use  . Vaping Use: Never used  Substance Use Topics  . Alcohol use: No  . Drug use: Never    Review of  Systems  Constitutional: No fever/chills.  Positive for generalized weakness and malaise. Eyes: No visual changes. ENT: No sore throat. Cardiovascular: Denies chest pain. Respiratory: Denies shortness of breath. Gastrointestinal: No abdominal pain.  No nausea, no vomiting.  No diarrhea.  No constipation. Genitourinary: Negative for dysuria. Musculoskeletal: Negative for back pain. Skin: Negative for rash. Neurological: Negative for headaches, focal weakness or numbness.  ____________________________________________   PHYSICAL EXAM:  VITAL SIGNS: ED Triage Vitals  Enc Vitals Group     BP 10/14/20 1753 (!) 163/91     Pulse Rate 10/14/20 1752 80     Resp 10/14/20 1752 16     Temp 10/14/20 1752 98.4 F (36.9 C)     Temp Source 10/14/20 1752 Oral     SpO2 10/14/20 1752 95 %     Weight 10/14/20 1749 170 lb (77.1 kg)     Height 10/14/20 1749 5\' 1"  (1.549 m)     Head Circumference --      Peak Flow --      Pain Score 10/14/20 1749 0     Pain Loc --      Pain Edu? --      Excl. in [place]? --     Constitutional: Alert and oriented. Eyes: Conjunctivae are normal. Head: Atraumatic. Nose: No congestion/rhinnorhea. Mouth/Throat: Mucous membranes are moist. Neck: Normal ROM Cardiovascular: Normal rate, regular rhythm. Grossly normal heart sounds. Respiratory: Normal respiratory effort.  No retractions. Lungs CTAB. Gastrointestinal: Soft and nontender. No distention. Genitourinary: deferred Musculoskeletal: No lower extremity tenderness nor edema. Neurologic:  Normal speech and language. No gross focal neurologic deficits are appreciated. Skin:  Skin is warm, dry and intact. No rash noted. Psychiatric: Mood and affect are normal. Speech and behavior are normal.  ____________________________________________   LABS (all labs ordered are listed, but only abnormal results are displayed)  Labs Reviewed  BASIC METABOLIC PANEL - Abnormal; Notable for the following components:       Result Value   Glucose, Bld 126 (*)    BUN 5 (*)    All other components within normal limits  CBC - Abnormal; Notable for the following components:   RBC 5.22 (*)    Hemoglobin 15.9 (*)    HCT 46.3 (*)    All other components within normal limits  URINALYSIS, COMPLETE (UACMP) WITH MICROSCOPIC - Abnormal; Notable for the following components:   Color, Urine STRAW (*)    APPearance HAZY (*)    Specific Gravity, Urine 1.001 (*)    Hgb urine dipstick SMALL (*)    Leukocytes,Ua LARGE (*)    WBC, UA >50 (*)    Bacteria, UA RARE (*)    Non Squamous Epithelial PRESENT (*)    All other components within normal limits  URINE CULTURE  CBG MONITORING,  ED   ____________________________________________  EKG  ED ECG REPORT I, Blake Divine, the attending physician, personally viewed and interpreted this ECG.   Date: 10/14/2020  EKG Time: 17:57  Rate: 74  Rhythm: normal sinus rhythm  Axis: Normal  Intervals:none  ST&T Change: None   PROCEDURES  Procedure(s) performed (including Critical Care):  Procedures   ____________________________________________   INITIAL IMPRESSION / ASSESSMENT AND PLAN / ED COURSE       87 year old female presents to the ED with worsening generalized weakness over the past 2 weeks associated with urinary frequency.  She was previously treated with nitrofurantoin but urine today again appears infected.  Vital signs are reassuring and there are no signs of sepsis.  Remainder of labs are unremarkable.  Patient was given a dose of IV Rocephin and urine was sent for culture.  She was hydrated with IV fluids and is appropriate for discharge home with PCP follow-up.  She will be prescribed Keflex as she has now tolerated cephalosporins.  She was counseled to return to the ED for new worsening symptoms, patient and daughter agree with plan.      ____________________________________________   FINAL CLINICAL IMPRESSION(S) / ED DIAGNOSES  Final diagnoses:   Generalized weakness  Acute cystitis without hematuria     ED Discharge Orders         Ordered    cephALEXin (KEFLEX) 500 MG capsule  2 times daily        10/14/20 2153           Note:  This document was prepared using Dragon voice recognition software and may include unintentional dictation errors.   Blake Divine, MD 10/15/20 [phone]

## 2020-10-17 LAB — URINE CULTURE: Culture: 80000 — AB

## 2020-10-20 DIAGNOSIS — E860 Dehydration: Secondary | ICD-10-CM | POA: Diagnosis not present

## 2020-10-20 DIAGNOSIS — F32A Depression, unspecified: Secondary | ICD-10-CM | POA: Diagnosis not present

## 2020-10-20 DIAGNOSIS — I10 Essential (primary) hypertension: Secondary | ICD-10-CM | POA: Diagnosis not present

## 2020-10-22 DIAGNOSIS — E860 Dehydration: Secondary | ICD-10-CM | POA: Diagnosis not present

## 2020-10-23 ENCOUNTER — Emergency Department: Payer: Medicare Other

## 2020-10-23 ENCOUNTER — Observation Stay
Admission: EM | Admit: 2020-10-23 | Discharge: 2020-10-24 | Disposition: A | Discharge status: Home / Self Care | Payer: Medicare Other | Attending: Internal Medicine | Admitting: Internal Medicine

## 2020-10-23 ENCOUNTER — Other Ambulatory Visit: Payer: Self-pay

## 2020-10-23 ENCOUNTER — Encounter: Payer: Self-pay | Admitting: Emergency Medicine

## 2020-10-23 DIAGNOSIS — Z79899 Other long term (current) drug therapy: Secondary | ICD-10-CM | POA: Insufficient documentation

## 2020-10-23 DIAGNOSIS — I639 Cerebral infarction, unspecified: Secondary | ICD-10-CM | POA: Diagnosis present

## 2020-10-23 DIAGNOSIS — J811 Chronic pulmonary edema: Secondary | ICD-10-CM | POA: Diagnosis not present

## 2020-10-23 DIAGNOSIS — N179 Acute kidney failure, unspecified: Secondary | ICD-10-CM | POA: Diagnosis present

## 2020-10-23 DIAGNOSIS — E785 Hyperlipidemia, unspecified: Secondary | ICD-10-CM | POA: Diagnosis not present

## 2020-10-23 DIAGNOSIS — E039 Hypothyroidism, unspecified: Secondary | ICD-10-CM | POA: Diagnosis not present

## 2020-10-23 DIAGNOSIS — E871 Hypo-osmolality and hyponatremia: Secondary | ICD-10-CM | POA: Diagnosis present

## 2020-10-23 DIAGNOSIS — R531 Weakness: Secondary | ICD-10-CM | POA: Diagnosis not present

## 2020-10-23 DIAGNOSIS — F32A Depression, unspecified: Secondary | ICD-10-CM | POA: Diagnosis present

## 2020-10-23 DIAGNOSIS — I10 Essential (primary) hypertension: Secondary | ICD-10-CM | POA: Diagnosis not present

## 2020-10-23 DIAGNOSIS — Z7982 Long term (current) use of aspirin: Secondary | ICD-10-CM | POA: Insufficient documentation

## 2020-10-23 DIAGNOSIS — Z20822 Contact with and (suspected) exposure to covid-19: Secondary | ICD-10-CM | POA: Diagnosis not present

## 2020-10-23 LAB — BASIC METABOLIC PANEL
Anion gap: 11 (ref 5–15)
Anion gap: 9 (ref 5–15)
Anion gap: 9 (ref 5–15)
BUN: 12 mg/dL (ref 8–23)
BUN: 11 mg/dL (ref 8–23)
BUN: 12 mg/dL (ref 8–23)
CO2: 23 mmol/L (ref 22–32)
CO2: 21 mmol/L — ABNORMAL LOW (ref 22–32)
CO2: 21 mmol/L — ABNORMAL LOW (ref 22–32)
Calcium: 9.6 mg/dL (ref 8.9–10.3)
Calcium: 9.2 mg/dL (ref 8.9–10.3)
Calcium: 9.3 mg/dL (ref 8.9–10.3)
Chloride: 95 mmol/L — ABNORMAL LOW (ref 98–111)
Chloride: 102 mmol/L (ref 98–111)
Chloride: 104 mmol/L (ref 98–111)
Creatinine, Ser: 1.28 mg/dL — ABNORMAL HIGH (ref 0.44–1.00)
Creatinine, Ser: 1.08 mg/dL — ABNORMAL HIGH (ref 0.44–1.00)
Creatinine, Ser: 1.04 mg/dL — ABNORMAL HIGH (ref 0.44–1.00)
GFR, Estimated: 41 mL/min — ABNORMAL LOW (ref >60)
GFR, Estimated: 51 mL/min — ABNORMAL LOW (ref >60)
GFR, Estimated: 53 mL/min — ABNORMAL LOW (ref >60)
Glucose, Bld: 172 mg/dL — ABNORMAL HIGH (ref 70–99)
Glucose, Bld: 202 mg/dL — ABNORMAL HIGH (ref 70–99)
Glucose, Bld: 184 mg/dL — ABNORMAL HIGH (ref 70–99)
Potassium: 4.7 mmol/L (ref 3.5–5.1)
Potassium: 4.6 mmol/L (ref 3.5–5.1)
Potassium: 5 mmol/L (ref 3.5–5.1)
Sodium: 129 mmol/L — ABNORMAL LOW (ref 135–145)
Sodium: 132 mmol/L — ABNORMAL LOW (ref 135–145)
Sodium: 134 mmol/L — ABNORMAL LOW (ref 135–145)

## 2020-10-23 LAB — CBC
HCT: 46.4 % — ABNORMAL HIGH (ref 36.0–46.0)
Hemoglobin: 15.9 g/dL — ABNORMAL HIGH (ref 12.0–15.0)
MCH: 30.6 pg (ref 26.0–34.0)
MCHC: 34.3 g/dL (ref 30.0–36.0)
MCV: 89.2 fL (ref 80.0–100.0)
Platelets: 267 10*3/uL (ref 150–400)
RBC: 5.2 MIL/uL — ABNORMAL HIGH (ref 3.87–5.11)
RDW: 13 % (ref 11.5–15.5)
WBC: 6.6 10*3/uL (ref 4.0–10.5)
nRBC: 0 % (ref 0.0–0.2)

## 2020-10-23 LAB — URINALYSIS, COMPLETE (UACMP) WITH MICROSCOPIC
Bilirubin Urine: NEGATIVE
Glucose, UA: NEGATIVE mg/dL
Hgb urine dipstick: NEGATIVE
Ketones, ur: NEGATIVE mg/dL
Leukocytes,Ua: NEGATIVE
Nitrite: NEGATIVE
Protein, ur: NEGATIVE mg/dL
Specific Gravity, Urine: 1.006 (ref 1.005–1.030)
pH: 6 (ref 5.0–8.0)

## 2020-10-23 LAB — T4, FREE: Free T4: 1.35 ng/dL — ABNORMAL HIGH (ref 0.61–1.12)

## 2020-10-23 LAB — RESP PANEL BY RT-PCR (FLU A&B, COVID) ARPGX2
Influenza A by PCR: NEGATIVE
Influenza B by PCR: NEGATIVE
SARS Coronavirus 2 by RT PCR: NEGATIVE

## 2020-10-23 LAB — HEPATIC FUNCTION PANEL
ALT: 66 U/L — ABNORMAL HIGH (ref 0–44)
AST: 66 U/L — ABNORMAL HIGH (ref 15–41)
Albumin: 4.1 g/dL (ref 3.5–5.0)
Alkaline Phosphatase: 48 U/L (ref 38–126)
Bilirubin, Direct: 0.2 mg/dL (ref 0.0–0.2)
Indirect Bilirubin: 0.4 mg/dL (ref 0.3–0.9)
Total Bilirubin: 0.6 mg/dL (ref 0.3–1.2)
Total Protein: 7.3 g/dL (ref 6.5–8.1)

## 2020-10-23 LAB — SODIUM, URINE, RANDOM: Sodium, Ur: 27 mmol/L

## 2020-10-23 LAB — OSMOLALITY: Osmolality: 285 mOsm/kg (ref 275–295)

## 2020-10-23 LAB — PHOSPHORUS: Phosphorus: 3 mg/dL (ref 2.5–4.6)

## 2020-10-23 LAB — MAGNESIUM: Magnesium: 2 mg/dL (ref 1.7–2.4)

## 2020-10-23 LAB — OSMOLALITY, URINE: Osmolality, Ur: 225 mOsm/kg — ABNORMAL LOW (ref 300–900)

## 2020-10-23 LAB — TROPONIN I (HIGH SENSITIVITY): Troponin I (High Sensitivity): 8 ng/L (ref <18)

## 2020-10-23 LAB — TSH: TSH: 0.496 u[IU]/mL (ref 0.350–4.500)

## 2020-10-23 MED ORDER — SODIUM CHLORIDE 0.9 % IV BOLUS
1000.0000 mL | Freq: Once | INTRAVENOUS | Status: AC
  Administered 2020-10-23: 1000 mL via INTRAVENOUS

## 2020-10-23 MED ORDER — LEVOTHYROXINE SODIUM 88 MCG PO TABS
88.0000 ug | ORAL_TABLET | Freq: Every day | ORAL | Status: DC
  Administered 2020-10-24: 05:00:00 88 ug via ORAL
  Filled 2020-10-23: qty 1

## 2020-10-23 MED ORDER — ENOXAPARIN SODIUM 40 MG/0.4ML ~~LOC~~ SOLN
40.0000 mg | SUBCUTANEOUS | Status: DC
  Administered 2020-10-23: 40 mg via SUBCUTANEOUS
  Filled 2020-10-23: qty 0.4

## 2020-10-23 MED ORDER — DOCUSATE SODIUM 100 MG PO CAPS: 100.0000 mg | ORAL_CAPSULE | Freq: Two times a day (BID) | ORAL | Status: DC | PRN

## 2020-10-23 MED ORDER — SIMVASTATIN 20 MG PO TABS: 20.0000 mg | ORAL_TABLET | Freq: Every day | ORAL | Status: DC

## 2020-10-23 MED ORDER — SIMVASTATIN 20 MG PO TABS
20.0000 mg | ORAL_TABLET | Freq: Every day | ORAL | Status: DC
  Administered 2020-10-23: 20 mg via ORAL
  Filled 2020-10-23: qty 1

## 2020-10-23 MED ORDER — DEXAMETHASONE SODIUM PHOSPHATE 10 MG/ML IJ SOLN
6.0000 mg | Freq: Once | INTRAMUSCULAR | Status: AC
  Administered 2020-10-23: 6 mg via INTRAVENOUS
  Filled 2020-10-23: qty 1

## 2020-10-23 MED ORDER — ONDANSETRON HCL 4 MG/2ML IJ SOLN: 4.0000 mg | Freq: Three times a day (TID) | INTRAMUSCULAR | Status: DC | PRN

## 2020-10-23 MED ORDER — PAROXETINE HCL 20 MG PO TABS
20.0000 mg | ORAL_TABLET | Freq: Every day | ORAL | Status: DC
  Administered 2020-10-23: 23:00:00 20 mg via ORAL
  Filled 2020-10-23 (×2): qty 1

## 2020-10-23 MED ORDER — AMLODIPINE BESYLATE 5 MG PO TABS
5.0000 mg | ORAL_TABLET | Freq: Every day | ORAL | Status: DC
  Administered 2020-10-24: 5 mg via ORAL
  Filled 2020-10-23: qty 1

## 2020-10-23 MED ORDER — SODIUM CHLORIDE 0.9 % IV SOLN: INTRAVENOUS | Status: DC

## 2020-10-23 MED ORDER — ASPIRIN EC 81 MG PO TBEC
81.0000 mg | DELAYED_RELEASE_TABLET | Freq: Every day | ORAL | Status: DC
  Administered 2020-10-24: 81 mg via ORAL
  Filled 2020-10-23: qty 1

## 2020-10-23 MED ORDER — DOCUSATE SODIUM 100 MG PO CAPS: 100.0000 mg | ORAL_CAPSULE | Freq: Two times a day (BID) | ORAL | Status: DC

## 2020-10-23 MED ORDER — ACETAMINOPHEN 325 MG PO TABS: 650.0000 mg | ORAL_TABLET | Freq: Four times a day (QID) | ORAL | Status: DC | PRN

## 2020-10-23 MED ORDER — METOPROLOL SUCCINATE ER 25 MG PO TB24
25.0000 mg | ORAL_TABLET | Freq: Every day | ORAL | Status: DC
  Administered 2020-10-24: 10:00:00 25 mg via ORAL
  Filled 2020-10-23: qty 1

## 2020-10-23 MED ORDER — HYDRALAZINE HCL 20 MG/ML IJ SOLN: 5.0000 mg | INTRAMUSCULAR | Status: DC | PRN

## 2020-10-23 NOTE — ED Triage Notes (Signed)
Pt to ED via POV with c/o continued weakness, has been seen for same for last 2 weeks. Pt's daughter states recently dx with UTI, has been on 2 abx for same. Pt's daughter reports recently seen at PCP. Pt's daughter reports increasing weakness and fatigue.

## 2020-10-23 NOTE — H&P (Signed)
History and Physical    Amy Roth [id number] DOB: May 13, 1934 DOA: 10/23/2020  Referring MD/NP/PA:   PCP: Perrin Maltese, MD   Patient coming from:  The patient is coming from home.  At baseline, pt is independent for most of ADL.        Chief Complaint: weakness  HPI: Amy Roth is a 87 y.o. female with medical history significant of hypertension, hyperlipidemia, stroke, GERD, hypothyroidism, depression, remote left leg DVT not on anticoagulants, dysrhythmia (not sure which type, patient denies history of A. fib) who presents with weakness.  Per her daughter, patient has been feeling weak for more than 2 weeks, which has been progressively worsening.  She does not have unilateral numbness or tingling to extremities.  No facial droop or slurred speech.  Patient has poor appetite and decreased oral intake.  No chest pain, cough, shortness of breath.  No nausea vomiting, diarrhea or abdominal pain.  No fever or chills.  Patient was recently treated with 2 course of antibiotics with Macrobid and then Bactrim.  She just finished course of Bactrim yesterday.  Currently patient does not have symptoms of UTI.  Per her daughter, her PCP checked her " ADH level" on Wednesday with pending results.  ED Course: pt was found to have WBC 6.6, trop 8, negative urinalysis, negative COVID PCR, AKI with creatinine 1.28, BUN 12 (creatinine 0.92 on 10/14/2020), temperature normal, blood pressure 144/77, heart rate 60-80s, RR 20, oxygen saturation 95% on room air currently.  Patient is placed on MedSurg bed for observation   Review of Systems:   General: no fevers, chills, no body weight gain, has poor appetite, has fatigue HEENT: no blurry vision, hearing changes or sore throat Respiratory: no dyspnea, coughing, wheezing CV: no chest pain, no palpitations GI: no nausea, vomiting, abdominal pain, diarrhea, constipation GU: no dysuria, burning on urination, increased urinary frequency,  hematuria  Ext: no leg edema Neuro: no unilateral weakness, numbness, or tingling, no vision change or hearing loss.   Skin: no rash, no skin tear. MSK: No muscle spasm, no deformity, no limitation of range of movement in spin Heme: No easy bruising.  Travel history: No recent long distant travel.  Allergy:  Allergies  Allergen Reactions  . Penicillins     Past Medical History:  Diagnosis Date  . Anxiety   . Arthritis   . Arthritis   . Complication of anesthesia    hard to wake up  . Depression   . DVT (deep venous thrombosis) ([place])   . Dysrhythmia   . Edema    feet/legs  . GERD (gastroesophageal reflux disease)   . Heart murmur   . Hyperlipidemia   . Hypertension   . Hypothyroidism   . Palpitation   . Stroke [facility])    facial    Past Surgical History:  Procedure Laterality Date  . BELPHAROPTOSIS REPAIR    . BREAST BIOPSY Left 2004   benign  . CARDIAC CATHETERIZATION    . CATARACT EXTRACTION W/PHACO Left 04/08/2015   Procedure: CATARACT EXTRACTION PHACO AND INTRAOCULAR LENS PLACEMENT (IOC);  Surgeon: Leandrew Koyanagi, MD;  Location: [facility] ORS;  Service: Ophthalmology;  Laterality: Left;  US01:11.3 AP15.9 CDE11.33 FLUID LOT# [id number]  . CATARACT EXTRACTION W/PHACO Right 06/03/2015   Procedure: CATARACT EXTRACTION PHACO AND INTRAOCULAR LENS PLACEMENT (IOC);  Surgeon: Leandrew Koyanagi, MD;  Location: [facility] ORS;  Service: Ophthalmology;  Laterality: Right;  [place]  1:12.8 AP   19.3 CDE  13.23 casette lot [id number]  .  CHOLECYSTECTOMY    . NECK SURGERY    . TUBAL LIGATION      Social History:  reports that she has never smoked. She has never used smokeless tobacco. She reports that she does not drink alcohol and does not use drugs.  Family History:  Family History  Problem Relation Age of Onset  . Hypertension Father   . Heart disease Father   . Hypertension Mother   . Heart disease Mother   . Breast cancer Neg Hx      Prior to Admission medications    Medication Sig Start Date End Date Taking? Authorizing Provider  amLODipine (NORVASC) 5 MG tablet Take 5 mg by mouth daily.    [provider]  aspirin 81 MG tablet Take 81 mg by mouth daily.    [provider]  benazepril (LOTENSIN) 20 MG tablet Take 20 mg by mouth daily.    [provider]  conjugated estrogens (PREMARIN) vaginal cream Apply 0.5mg  (pea-sized amount)  just inside the vaginal introitus with a finger-tip on  Monday, Wednesday and Friday nights. 05/13/18   Zara Council A, PA-C  docusate sodium (COLACE) 100 MG capsule Take 100 mg by mouth 2 (two) times daily.    [provider]  levothyroxine (SYNTHROID, LEVOTHROID) 75 MCG tablet  04/03/18   [provider]  meloxicam (MOBIC) 15 MG tablet Take 15 mg by mouth daily.    [provider]  metoprolol succinate (TOPROL-XL) 25 MG 24 hr tablet Take 25 mg by mouth daily.    [provider]  miconazole (MICONAZOLE 3) 200 MG vaginal suppository Place 1 suppository (200 mg total) vaginally at bedtime. 06/12/18   Zara Council A, PA-C  PARoxetine (PAXIL) 20 MG tablet Take 20 mg by mouth at bedtime.    [provider]  simvastatin (ZOCOR) 20 MG tablet  04/03/18   [provider]    Physical Exam: Vitals:   10/23/20 1154 10/23/20 1258 10/23/20 1406 10/23/20 1457  BP: (!) 154/100 (!) 145/89 (!) 144/77 139/69  Pulse: 72 75 88 68  Resp: 20 20 17 13   Temp: 98 F (36.7 C)     TempSrc: Oral     SpO2: 95% 98% 96% 94%  Weight:      Height:       General: Not in acute distress HEENT:       Eyes: PERRL, EOMI, no scleral icterus.       ENT: No discharge from the ears and nose, no pharynx injection, no tonsillar enlargement.        Neck: No JVD, no bruit, no mass felt. Heme: No neck lymph node enlargement. Cardiac: S1/S2, RRR, No gallops or rubs. Respiratory: No rales, wheezing, rhonchi or rubs. GI: Soft, nondistended, nontender, no rebound pain, no  organomegaly, BS present. GU: No hematuria Ext: No pitting leg edema bilaterally. 1+DP/PT pulse bilaterally. Musculoskeletal: No joint deformities, No joint redness or warmth, no limitation of ROM in spin. Skin: No rashes.  Neuro: Alert, oriented X3, cranial nerves II-XII grossly intact, moves all extremities normally.  Psych: Patient is not psychotic, no suicidal or hemocidal ideation.  Labs on Admission: I have personally reviewed following labs and imaging studies  CBC: Recent Labs  Lab 10/23/20 1152  WBC 6.6  HGB 15.9*  HCT 46.4*  MCV 89.2  PLT 973   Basic Metabolic Panel: Recent Labs  Lab 10/23/20 1152  NA 129*  K 4.7  CL 95*  CO2 23  GLUCOSE 172*  BUN  12  CREATININE 1.28*  CALCIUM 9.6  MG 2.0   GFR: Estimated Creatinine Clearance: 30.7 mL/min (A) (by C-G formula based on SCr of 1.28 mg/dL (H)). Liver Function Tests: Recent Labs  Lab 10/23/20 1152  AST 66*  ALT 66*  ALKPHOS 48  BILITOT 0.6  PROT 7.3  ALBUMIN 4.1   No results for input(s): LIPASE, AMYLASE in the last 168 hours. No results for input(s): AMMONIA in the last 168 hours. Coagulation Profile: No results for input(s): INR, PROTIME in the last 168 hours. Cardiac Enzymes: No results for input(s): CKTOTAL, CKMB, CKMBINDEX, TROPONINI in the last 168 hours. BNP (last 3 results) No results for input(s): PROBNP in the last 8760 hours. HbA1C: No results for input(s): HGBA1C in the last 72 hours. CBG: No results for input(s): GLUCAP in the last 168 hours. Lipid Profile: No results for input(s): CHOL, HDL, LDLCALC, TRIG, CHOLHDL, LDLDIRECT in the last 72 hours. Thyroid Function Tests: Recent Labs    10/23/20 1152  TSH 0.496  FREET4 1.35*   Anemia Panel: No results for input(s): VITAMINB12, FOLATE, FERRITIN, TIBC, IRON, RETICCTPCT in the last 72 hours. Urine analysis:    Component Value Date/Time   COLORURINE YELLOW (A) 10/23/2020 1400   APPEARANCEUR CLEAR (A) 10/23/2020 1400   LABSPEC  1.006 10/23/2020 1400   PHURINE 6.0 10/23/2020 1400   GLUCOSEU NEGATIVE 10/23/2020 1400   HGBUR NEGATIVE 10/23/2020 1400   BILIRUBINUR NEGATIVE 10/23/2020 1400   KETONESUR NEGATIVE 10/23/2020 1400   PROTEINUR NEGATIVE 10/23/2020 1400   NITRITE NEGATIVE 10/23/2020 1400   LEUKOCYTESUR NEGATIVE 10/23/2020 1400   Sepsis Labs: @LABRCNTIP (procalcitonin:4,lacticidven:4) ) Recent Results (from the past 240 hour(s))  Urine culture     Status: Abnormal   Collection Time: 10/14/20  7:47 PM   Specimen: Urine, Random  Result Value Ref Range Status   Specimen Description   Final    URINE, RANDOM Performed at [facility], [address] [place], [place] [address]    Special Requests   Final    NONE Performed at [facility], [address] [place], [place] [address]    Culture 80,000 COLONIES/mL CITROBACTER FREUNDII (A)  Final   Report Status 10/17/2020 FINAL  Final   Organism ID, Bacteria CITROBACTER FREUNDII (A)  Final      Susceptibility   Citrobacter freundii - MIC*    CEFAZOLIN >=64 RESISTANT Resistant     CEFEPIME <=0.12 SENSITIVE Sensitive     CEFTRIAXONE <=0.25 SENSITIVE Sensitive     CIPROFLOXACIN <=0.25 SENSITIVE Sensitive     GENTAMICIN <=1 SENSITIVE Sensitive     IMIPENEM 1 SENSITIVE Sensitive     NITROFURANTOIN 64 INTERMEDIATE Intermediate     TRIMETH/SULFA <=20 SENSITIVE Sensitive     PIP/TAZO <=4 SENSITIVE Sensitive     * 80,000 COLONIES/mL CITROBACTER FREUNDII  Resp Panel by RT-PCR (Flu A&B, Covid) Nasopharyngeal Swab     Status: None   Collection Time: 10/23/20 12:11 PM   Specimen: Nasopharyngeal Swab; Nasopharyngeal(NP) swabs in vial transport medium  Result Value Ref Range Status   SARS Coronavirus 2 by RT PCR NEGATIVE NEGATIVE Final    Comment: (NOTE) SARS-CoV-2 target nucleic acids are NOT DETECTED.  The SARS-CoV-2 RNA is generally detectable in upper respiratory specimens during the acute phase of infection. The lowest concentration of  SARS-CoV-2 viral copies this assay can detect is 138 copies/mL. A negative result does not preclude SARS-Cov-2 infection and should not be used as the sole basis for treatment or other patient management  decisions. A negative result may occur with  improper specimen collection/handling, submission of specimen other than nasopharyngeal swab, presence of viral mutation(s) within the areas targeted by this assay, and inadequate number of viral copies(<138 copies/mL). A negative result must be combined with clinical observations, patient history, and epidemiological information. The expected result is Negative.  Fact Sheet for Patients:  EntrepreneurPulse.com.au  Fact Sheet for Healthcare Providers:  IncredibleEmployment.be  This test is no t yet approved or cleared by the [place] FDA and  has been authorized for detection and/or diagnosis of SARS-CoV-2 by FDA under an Emergency Use Authorization (EUA). This EUA will remain  in effect (meaning this test can be used) for the duration of the COVID-19 declaration under Section 564(b)(1) of the Act, 21 U.S.C.section 360bbb-3(b)(1), unless the authorization is terminated  or revoked sooner.       Influenza A by PCR NEGATIVE NEGATIVE Final   Influenza B by PCR NEGATIVE NEGATIVE Final    Comment: (NOTE) The Xpert Xpress SARS-CoV-2/FLU/RSV plus assay is intended as an aid in the diagnosis of influenza from Nasopharyngeal swab specimens and should not be used as a sole basis for treatment. Nasal washings and aspirates are unacceptable for Xpert Xpress SARS-CoV-2/FLU/RSV testing.  Fact Sheet for Patients: EntrepreneurPulse.com.au  Fact Sheet for Healthcare Providers: IncredibleEmployment.be  This test is not yet approved or cleared by the [place] FDA and has been authorized for detection and/or diagnosis of SARS-CoV-2 by FDA under an Emergency Use  Authorization (EUA). This EUA will remain in effect (meaning this test can be used) for the duration of the COVID-19 declaration under Section 564(b)(1) of the Act, 21 U.S.C. section 360bbb-3(b)(1), unless the authorization is terminated or revoked.  Performed at [facility] [place], [address] [place], [place] [address]      Radiological Exams on Admission: DG Chest Portable 1 View  Result Date: 10/23/2020 CLINICAL DATA:  Weakness. EXAM: PORTABLE CHEST 1 VIEW COMPARISON:  None. FINDINGS: Cardiac enlargement. Aortic atherosclerosis. Low lung volumes with eventration of the right hemidiaphragm. Pulmonary vascular congestion without overt edema. No pleural effusion or airspace disease. IMPRESSION: Cardiac enlargement and pulmonary vascular congestion. Electronically Signed   By: Kerby Moors M.D.   On: 10/23/2020 14:11     EKG: I have personally reviewed.  Sinus rhythm, QTC 414, low voltage, Q waves in lead III/aVF, RAD.  Assessment/Plan Principal Problem:   Generalized weakness Active Problems:   Hypertension   Hypothyroidism   Depression   Hyperlipidemia   Stroke (HCC)   AKI (acute kidney injury) ([place])   Hyponatremia   Generalized weakness: Likely multifactorial etiology, including hyponatremia, AKI, recent UTI.  Another differential diagnosis is adrenal insufficiency which is low suspicions.  Patient was given 1 dose of Decadron 10 mg in ED.  -Placed on MedSurg bed for observation -Treat underlying issues: Hyponatremia, AKI -Check cortisol level in morning -PT/OT  Hypertension -IV hydralazine as needed -Continue home amlodipine, metoprolol -Hold Lotensin due to AKI  Hypothyroidism: TSH normal 0.496, TSH 1.35 which is slightly elevated -Synthroid  Depression -Paxil  Hyperlipidemia -Zocor  Stroke ([place]) -Aspirin Zocor  AKI (acute kidney injury) ([place]): Likely multifactorial etiology, including recently UTI, dehydration and continuation of Lotensin,  recent Bactrim use, patient also takes Mobic -IV fluid as above -Hold Lotensin and Mobic  Hyponatremia: likely due to poor oral intake and dehydration, and continuation of Lotensin.  TSH normal - Will check urine sodium, urine osmolality, serum osmolality. - IVF: 1L NS in ED, will continue with  IV normal saline at 75 mL/h -Fluid restriction - f/u by BMP q8h - avoid over correction too fast due to risk of central pontine myelinolysis - Hold Lotensin   DVT ppx: SQ Lovenox Code Status: partial code (I discussed with the patient in the presence of daughter, and explained the meaning of CODE STATUS, patient wants to be partial code, OK for CPR, but no intubation). Family Communication:   Yes, patient's daughter at bed side Disposition Plan:  Anticipate discharge back to previous environment Consults called:  none Admission status and Level of care: Med-Surg:   for obs   Status is: Observation  The patient remains OBS appropriate and will d/c before 2 midnights.  Dispo: The patient is from: Home              Anticipated d/c is to: Home              Patient currently is not medically stable to d/c.   Difficult to place patient No          Date of Service 10/23/2020    [facility]   If 7PM-7AM, please contact night-coverage [email or web address] 10/23/2020, 5:41 PM

## 2020-10-23 NOTE — ED Provider Notes (Signed)
[facility] Emergency Department Provider Note  ____________________________________________  Time seen: Approximately 2:44 PM  I have reviewed the triage vital signs and the nursing notes.   HISTORY  Chief Complaint Weakness    HPI Amy Roth is a 87 y.o. female with a history of depression DVT GERD hypertension hypothyroidism who comes ED complaining of generalized weakness for the last 2 weeks.  Constant, worsening.  Used to be able to ambulate independently, now unable to take more than a few steps.  Denies chest pain or shortness of breath, no dysuria abdominal pain vomiting diarrhea.  She has loss of appetite, but still eating and drinking fluids.  She is followed up with PCP Dr. Lamonte Sakai, completed course of antibiotics for UTI, recently had an ADH test and 24-hour urine collection.  The results of those have not yet returned.      Past Medical History:  Diagnosis Date  . Anxiety   . Arthritis   . Arthritis   . Complication of anesthesia    hard to wake up  . Depression   . DVT (deep venous thrombosis) ([place])   . Dysrhythmia   . Edema    feet/legs  . GERD (gastroesophageal reflux disease)   . Heart murmur   . Hyperlipidemia   . Hypertension   . Hypothyroidism   . Palpitation   . Stroke [facility])    facial     There are no problems to display for this patient.    Past Surgical History:  Procedure Laterality Date  . BELPHAROPTOSIS REPAIR    . BREAST BIOPSY Left 2004   benign  . CARDIAC CATHETERIZATION    . CATARACT EXTRACTION W/PHACO Left 04/08/2015   Procedure: CATARACT EXTRACTION PHACO AND INTRAOCULAR LENS PLACEMENT (IOC);  Surgeon: Leandrew Koyanagi, MD;  Location: [facility] ORS;  Service: Ophthalmology;  Laterality: Left;  US01:11.3 AP15.9 CDE11.33 FLUID LOT# [id number]  . CATARACT EXTRACTION W/PHACO Right 06/03/2015   Procedure: CATARACT EXTRACTION PHACO AND INTRAOCULAR LENS PLACEMENT (IOC);  Surgeon: Leandrew Koyanagi, MD;  Location: [facility] ORS;  Service: Ophthalmology;  Laterality: Right;  [place]  1:12.8 AP   19.3 CDE  13.23 casette lot #[id number]  . CHOLECYSTECTOMY    . NECK SURGERY    . TUBAL LIGATION       Prior to Admission medications   Medication Sig Start Date End Date Taking? Authorizing Provider  amLODipine (NORVASC) 5 MG tablet Take 5 mg by mouth daily.    [provider]  aspirin 81 MG tablet Take 81 mg by mouth daily.    [provider]  benazepril (LOTENSIN) 20 MG tablet Take 20 mg by mouth daily.    [provider]  conjugated estrogens (PREMARIN) vaginal cream Apply 0.5mg  (pea-sized amount)  just inside the vaginal introitus with a finger-tip on  Monday, Wednesday and Friday nights. 05/13/18   Zara Council A, PA-C  docusate sodium (COLACE) 100 MG capsule Take 100 mg by mouth 2 (two) times daily.    [provider]  levothyroxine (SYNTHROID, LEVOTHROID) 75 MCG tablet  04/03/18   [provider]  meloxicam (MOBIC) 15 MG tablet Take 15 mg by mouth daily.    [provider]  metoprolol succinate (TOPROL-XL) 25 MG 24 hr tablet Take 25 mg by mouth daily.    [provider]  miconazole (MICONAZOLE 3) 200 MG vaginal suppository Place 1 suppository (200 mg total) vaginally at bedtime. 06/12/18   Zara Council A, PA-C  PARoxetine (PAXIL) 20 MG  tablet Take 20 mg by mouth at bedtime.    [provider]  simvastatin (ZOCOR) 20 MG tablet  04/03/18   [provider]     Allergies Penicillins   Family History  Problem Relation Age of Onset  . Breast cancer Neg Hx     Social History Social History   Tobacco Use  . Smoking status: Never Smoker  . Smokeless tobacco: Never Used  Vaping Use  . Vaping Use: Never used  Substance Use Topics  . Alcohol use: No  . Drug use: Never    Review of Systems  Constitutional:   No fever or chills.  ENT:   No sore throat. No rhinorrhea. Cardiovascular:   No  chest pain or syncope. Respiratory:   No dyspnea or cough. Gastrointestinal:   Negative for abdominal pain, vomiting and diarrhea.  Musculoskeletal:   Negative for focal pain or swelling All other systems reviewed and are negative except as documented above in ROS and HPI.  ____________________________________________   PHYSICAL EXAM:  VITAL SIGNS: ED Triage Vitals  Enc Vitals Group     BP 10/23/20 1154 (!) 154/100     Pulse Rate 10/23/20 1154 72     Resp 10/23/20 1154 20     Temp 10/23/20 1154 98 F (36.7 C)     Temp Source 10/23/20 1154 Oral     SpO2 10/23/20 1154 95 %     Weight 10/23/20 1151 170 lb (77.1 kg)     Height 10/23/20 1151 5\' 1"  (1.549 m)     Head Circumference --      Peak Flow --      Pain Score 10/23/20 1150 0     Pain Loc --      Pain Edu? --      Excl. in [place]? --     Vital signs reviewed, nursing assessments reviewed.   Constitutional:   Alert and oriented. Non-toxic appearance. Eyes:   Conjunctivae are normal. EOMI. PERRL. ENT      Head:   Normocephalic and atraumatic.      Nose: Normal.      Mouth/Throat: Dry mucous membranes      Neck:   No meningismus. Full ROM.  Thyroid nonpalpable Hematological/Lymphatic/Immunilogical:   No cervical lymphadenopathy. Cardiovascular:   RRR. Symmetric bilateral radial and DP pulses.  No murmurs. Cap refill less than 2 seconds. Respiratory:   Normal respiratory effort without tachypnea/retractions. Breath sounds are clear and equal bilaterally. No wheezes/rales/rhonchi. Gastrointestinal:   Soft and nontender. Non distended. There is no CVA tenderness.  No rebound, rigidity, or guarding. Genitourinary:   deferred Musculoskeletal:   Normal range of motion in all extremities. No joint effusions.  No lower extremity tenderness.  No edema. Neurologic:   Normal speech and language.  Motor grossly intact. No acute focal neurologic deficits are appreciated.  Skin:    Skin is warm, dry and intact. No rash noted.  No  petechiae, purpura, or bullae.  ____________________________________________    LABS (pertinent positives/negatives) (all labs ordered are listed, but only abnormal results are displayed) Labs Reviewed  BASIC METABOLIC PANEL - Abnormal; Notable for the following components:      Result Value   Sodium 129 (*)    Chloride 95 (*)    Glucose, Bld 172 (*)    Creatinine, Ser 1.28 (*)    GFR, Estimated 41 (*)    All other components within normal limits  CBC - Abnormal; Notable for the following components:   RBC  5.20 (*)    Hemoglobin 15.9 (*)    HCT 46.4 (*)    All other components within normal limits  URINALYSIS, COMPLETE (UACMP) WITH MICROSCOPIC - Abnormal; Notable for the following components:   Color, Urine YELLOW (*)    APPearance CLEAR (*)    Bacteria, UA RARE (*)    All other components within normal limits  T4, FREE - Abnormal; Notable for the following components:   Free T4 1.35 (*)    All other components within normal limits  HEPATIC FUNCTION PANEL - Abnormal; Notable for the following components:   AST 66 (*)    ALT 66 (*)    All other components within normal limits  RESP PANEL BY RT-PCR (FLU A&B, COVID) ARPGX2  TSH  MAGNESIUM  CBG MONITORING, ED   ____________________________________________   EKG  Interpreted by me Sinus rhythm rate of 73, right axis, normal intervals.  Normal QRS ST segments and T waves.  No ischemic changes  ____________________________________________    RADIOLOGY  DG Chest Portable 1 View  Result Date: 10/23/2020 CLINICAL DATA:  Weakness. EXAM: PORTABLE CHEST 1 VIEW COMPARISON:  None. FINDINGS: Cardiac enlargement. Aortic atherosclerosis. Low lung volumes with eventration of the right hemidiaphragm. Pulmonary vascular congestion without overt edema. No pleural effusion or airspace disease. IMPRESSION: Cardiac enlargement and pulmonary vascular congestion. Electronically Signed   By: Kerby Moors M.D.   On: 10/23/2020 14:11     ____________________________________________   PROCEDURES Procedures  ____________________________________________  DIFFERENTIAL DIAGNOSIS   Electrolyte abnormality, dehydration, hypothyroidism, viral illness, UTI, adrenal insufficiency, anemia  CLINICAL IMPRESSION / ASSESSMENT AND PLAN / ED COURSE  Medications ordered in the ED: Medications  sodium chloride 0.9 % bolus 1,000 mL (1,000 mLs Intravenous New Bag/Given 10/23/20 1405)  dexamethasone (DECADRON) injection 6 mg (6 mg Intravenous Given 10/23/20 1406)    Pertinent labs & imaging results that were available during my care of the patient were reviewed by me and considered in my medical decision making (see chart for details).  Amy Roth was evaluated in Emergency Department on 10/23/2020 for the symptoms described in the history of present illness. She was evaluated in the context of the global COVID-19 pandemic, which necessitated consideration that the patient might be at risk for infection with the SARS-CoV-2 virus that causes COVID-19. Institutional protocols and algorithms that pertain to the evaluation of patients at risk for COVID-19 are in a state of rapid change based on information released by regulatory bodies including the CDC and federal and state organizations. These policies and algorithms were followed during the patient's care in the ED.   Patient presents with progressive generalized weakness, now debilitating.  Vital signs unremarkable, exam is nonfocal.  She is nontoxic and alert.  Labs show normal urinalysis, baseline CBC, normal TSH.  Chemistry panel shows a sodium level of 129, down from usual baseline of about 136.  Acute hyponatremia may be causing worsening weakness.  Chest x-ray viewed and interpreted by me, unremarkable, no evidence of pleural effusion or pneumonia.  Will check COVID/flu, and in discussion with patient and her daughter at bedside, will plan to admit due to severity of  symptoms and feeling no longer able to manage them in the outpatient setting thus far.  We will give a dose of dexamethasone which may help symptomatically.      ____________________________________________   FINAL CLINICAL IMPRESSION(S) / ED DIAGNOSES    Final diagnoses:  Generalized weakness  Hyponatremia     ED Discharge Orders  None      Portions of this note were generated with dragon dictation software. Dictation errors may occur despite best attempts at proofreading.   Carrie Mew, MD 10/23/20 [phone]

## 2020-10-24 ENCOUNTER — Observation Stay: Payer: Medicare Other

## 2020-10-24 DIAGNOSIS — E871 Hypo-osmolality and hyponatremia: Secondary | ICD-10-CM | POA: Diagnosis not present

## 2020-10-24 DIAGNOSIS — E039 Hypothyroidism, unspecified: Secondary | ICD-10-CM

## 2020-10-24 DIAGNOSIS — G249 Dystonia, unspecified: Secondary | ICD-10-CM | POA: Diagnosis not present

## 2020-10-24 DIAGNOSIS — I10 Essential (primary) hypertension: Secondary | ICD-10-CM | POA: Diagnosis not present

## 2020-10-24 DIAGNOSIS — R531 Weakness: Secondary | ICD-10-CM | POA: Diagnosis not present

## 2020-10-24 LAB — BASIC METABOLIC PANEL
Anion gap: 8 (ref 5–15)
Anion gap: 8 (ref 5–15)
BUN: 12 mg/dL (ref 8–23)
BUN: 12 mg/dL (ref 8–23)
CO2: 21 mmol/L — ABNORMAL LOW (ref 22–32)
CO2: 22 mmol/L (ref 22–32)
Calcium: 9.2 mg/dL (ref 8.9–10.3)
Calcium: 9 mg/dL (ref 8.9–10.3)
Chloride: 106 mmol/L (ref 98–111)
Chloride: 105 mmol/L (ref 98–111)
Creatinine, Ser: 1 mg/dL (ref 0.44–1.00)
Creatinine, Ser: 0.99 mg/dL (ref 0.44–1.00)
GFR, Estimated: 56 mL/min — ABNORMAL LOW (ref >60)
GFR, Estimated: 56 mL/min — ABNORMAL LOW (ref >60)
Glucose, Bld: 150 mg/dL — ABNORMAL HIGH (ref 70–99)
Glucose, Bld: 146 mg/dL — ABNORMAL HIGH (ref 70–99)
Potassium: 4.8 mmol/L (ref 3.5–5.1)
Potassium: 4.6 mmol/L (ref 3.5–5.1)
Sodium: 135 mmol/L (ref 135–145)
Sodium: 135 mmol/L (ref 135–145)

## 2020-10-24 LAB — CBC
HCT: 45.3 % (ref 36.0–46.0)
Hemoglobin: 15.5 g/dL — ABNORMAL HIGH (ref 12.0–15.0)
MCH: 30.8 pg (ref 26.0–34.0)
MCHC: 34.2 g/dL (ref 30.0–36.0)
MCV: 90.1 fL (ref 80.0–100.0)
Platelets: 255 10*3/uL (ref 150–400)
RBC: 5.03 MIL/uL (ref 3.87–5.11)
RDW: 13.2 % (ref 11.5–15.5)
WBC: 7.6 10*3/uL (ref 4.0–10.5)
nRBC: 0 % (ref 0.0–0.2)

## 2020-10-24 LAB — CORTISOL-AM, BLOOD: Cortisol - AM: 2.1 ug/dL — ABNORMAL LOW (ref 6.7–22.6)

## 2020-10-24 MED ORDER — LORATADINE 10 MG PO TABS
10.0000 mg | ORAL_TABLET | Freq: Every day | ORAL | Status: DC
  Administered 2020-10-24: 10:00:00 10 mg via ORAL
  Filled 2020-10-24: qty 1

## 2020-10-24 MED ORDER — LEVOTHYROXINE SODIUM 75 MCG PO TABS: 75.0000 ug | ORAL_TABLET | Freq: Every day | ORAL | 1 refills | Status: DC

## 2020-10-24 MED ORDER — SULFAMETHOXAZOLE-TRIMETHOPRIM 800-160 MG PO TABS
1.0000 | ORAL_TABLET | Freq: Two times a day (BID) | ORAL | Status: DC
  Administered 2020-10-24: 1 via ORAL
  Filled 2020-10-24 (×2): qty 1

## 2020-10-24 MED ORDER — SULFAMETHOXAZOLE-TRIMETHOPRIM 800-160 MG PO TABS: 1.0000 | ORAL_TABLET | Freq: Two times a day (BID) | ORAL | 0 refills | Status: DC

## 2020-10-24 NOTE — Progress Notes (Signed)
MD order received in [facility] to discharge pt home today; verbally reviewed AVS with pt; no questions voiced at this time; pt discharged via wheelchair by a volunteer to the [place] entrance with her son

## 2020-10-24 NOTE — Discharge Summary (Signed)
Physician Discharge Summary  Amy Roth [id number] DOB: 12-20-1933 DOA: 10/23/2020  PCP: Perrin Maltese, MD  Admit date: 10/23/2020 Discharge date: 10/24/2020  Admitted From:Home Disposition:  Home   Recommendations for Outpatient Follow-up:  1. Follow up with PCP in 1-2 weeks 2. Please obtain BMP/CBC in one week 3. Repeat TSH and free T4 in 4 weeks and adjust Synthroid dose if needing  Home Health: Was offered, but patient politely declined Equipment/Devices: Rolling  walker was offered, but family  reports she has 1 at home  Discharge Condition:Stable CODE STATUS:partial Diet recommendation: Heart Healthy   Brief/Interim Summary:  a 87 y.o. female with medical history significant of hypertension, hyperlipidemia, stroke, GERD, hypothyroidism, depression, remote left leg DVT not on anticoagulants, dysrhythmia (not sure which type, patient denies history of A. fib) who presents with weakness.  Patient was diagnosed with UTI 10 days ago, she was treated with Keflex from ED, patient reports symptoms she has not been feeling well overall poor appetite, generally weak, she came to ED for further evaluation, her work-up significant for mild hyponatremia at 129, so she was admitted for further work-up.  Generalized weakness -She is with no focal deficits (has left facial droop at baseline) T head with no acute findings, most likely due to poor oral intake, mild hyponatremia, and actually treated UTI, she was seen by PT recommendation for home health, patient/family politely declined reporting that her multiple family members are able to assist at home. -See discussion below under hypothyroidism hypothyroidism, this may be contributing to her symptoms as well.  Recent UTI -Patient was diagnosed with UTI 4/14, treated with Keflex, urine culture growing Citrobacter, resistant to cefazolin, even though her UA remains I will treat her with 3 days of Bactrim giving her  symptoms.  Hyponatremia -Mild, volume depletion, resolved with IV normal saline  Does not -TSH normal at 0.496, free mildly elevated at 1.35 patient does report some insomnia, restlessness, and some tremors, so given these symptoms, and borderline low TSH, and mildly elevated free T4, I will get and decrease her Synthroid dose from 88 mcg > 75 mcg,  - repeat levels in 4 weeks.   Hypertension -Continue home medications  History of CVA -Continue with aspirin and statin   Discharge Diagnoses:  Principal Problem:   Generalized weakness Active Problems:   Hypertension   Hypothyroidism   Depression   Hyperlipidemia   Stroke ([place])   AKI (acute kidney injury) ([place])   Hyponatremia    Discharge Instructions  Discharge Instructions    Diet - low sodium heart healthy   Complete by: As directed    Discharge instructions   Complete by: As directed    Follow with Primary MD Perrin Maltese, MD in 7 days   Get CBC, CMP,checked  by Primary MD next visit.    Activity: As tolerated with Full fall precautions use walker/cane & assistance as needed   Disposition Home    Diet: Heart Healthy  , with feeding assistance and aspiration precautions.  On your next visit with your primary care physician please Get Medicines reviewed and adjusted.   Please request your Prim.MD to go over all Hospital Tests and Procedure/Radiological results at the follow up, please get all Hospital records sent to your Prim MD by signing hospital release before you go home.   If you experience worsening of your admission symptoms, develop shortness of breath, life threatening emergency, suicidal or homicidal thoughts you must seek medical attention immediately by calling 911  or calling your MD immediately  if symptoms less severe.  You Must read complete instructions/literature along with all the possible adverse reactions/side effects for all the Medicines you take and that have been prescribed to you.  Take any new Medicines after you have completely understood and accpet all the possible adverse reactions/side effects.   Do not drive, operating heavy machinery, perform activities at heights, swimming or participation in water activities or provide baby sitting services if your were admitted for syncope or siezures until you have seen by Primary MD or a Neurologist and advised to do so again.  Do not drive when taking Pain medications.    Do not take more than prescribed Pain, Sleep and Anxiety Medications  Special Instructions: If you have smoked or chewed Tobacco  in the last 2 yrs please stop smoking, stop any regular Alcohol  and or any Recreational drug use.  Wear Seat belts while driving.   Please note  You were cared for by a hospitalist during your hospital stay. If you have any questions about your discharge medications or the care you received while you were in the hospital after you are discharged, you can call the unit and asked to speak with the hospitalist on call if the hospitalist that took care of you is not available. Once you are discharged, your primary care physician will handle any further medical issues. Please note that NO REFILLS for any discharge medications will be authorized once you are discharged, as it is imperative that you return to your primary care physician (or establish a relationship with a primary care physician if you do not have one) for your aftercare needs so that they can reassess your need for medications and monitor your lab values.   Increase activity slowly   Complete by: As directed      Allergies as of 10/24/2020      Reactions   Penicillins       Medication List    STOP taking these medications   conjugated estrogens vaginal cream Commonly known as: Premarin   metoprolol tartrate 25 MG tablet Commonly known as: LOPRESSOR   miconazole 200 MG vaginal suppository Commonly known as: Miconazole 3     TAKE these medications    amLODipine 5 MG tablet Commonly known as: NORVASC Take 5 mg by mouth daily.   aspirin 81 MG tablet Take 81 mg by mouth daily.   benazepril 20 MG tablet Commonly known as: LOTENSIN Take 20 mg by mouth daily.   docusate sodium 100 MG capsule Commonly known as: COLACE Take 100 mg by mouth 2 (two) times daily.   levothyroxine 75 MCG tablet Commonly known as: SYNTHROID Take 1 tablet (75 mcg total) by mouth daily before breakfast. What changed:   how much to take  how to take this  when to take this  Another medication with the same name was removed. Continue taking this medication, and follow the directions you see here.   meloxicam 15 MG tablet Commonly known as: MOBIC Take 15 mg by mouth daily.   metoprolol succinate 25 MG 24 hr tablet Commonly known as: TOPROL-XL Take 25 mg by mouth daily.   PARoxetine 20 MG tablet Commonly known as: PAXIL Take 20 mg by mouth at bedtime.   simvastatin 20 MG tablet Commonly known as: ZOCOR   sulfamethoxazole-trimethoprim 800-160 MG tablet Commonly known as: BACTRIM DS Take 1 tablet by mouth 2 (two) times daily.   Vitamin D (Ergocalciferol) 1.25 MG (50000 UNIT) Caps  capsule Commonly known as: DRISDOL Take 1 capsule by mouth once a week.       Allergies  Allergen Reactions  . Penicillins     Consultations:  none   Procedures/Studies: CT HEAD WO CONTRAST  Result Date: 10/24/2020 CLINICAL DATA:  Dystonia.  Weakness and tremors EXAM: CT HEAD WITHOUT CONTRAST TECHNIQUE: Contiguous axial images were obtained from the base of the skull through the vertex without intravenous contrast. COMPARISON:  05/01/2017 FINDINGS: Brain: No evidence of acute infarction, hemorrhage, hydrocephalus, extra-axial collection or mass lesion/mass effect. Cerebral volume loss in keeping with aging. Vascular: No hyperdense vessel or unexpected calcification. Skull: Normal. Negative for fracture or focal lesion. Sinuses/Orbits: No acute finding.  IMPRESSION: No acute finding or explanation for symptoms. Electronically Signed   By: Monte Fantasia M.D.   On: 10/24/2020 10:14   DG Chest Portable 1 View  Result Date: 10/23/2020 CLINICAL DATA:  Weakness. EXAM: PORTABLE CHEST 1 VIEW COMPARISON:  None. FINDINGS: Cardiac enlargement. Aortic atherosclerosis. Low lung volumes with eventration of the right hemidiaphragm. Pulmonary vascular congestion without overt edema. No pleural effusion or airspace disease. IMPRESSION: Cardiac enlargement and pulmonary vascular congestion. Electronically Signed   By: Kerby Moors M.D.   On: 10/23/2020 14:11     Subjective: [place] she is feeling better today, asking when she can go home.  Discharge Exam: Vitals:   10/24/20 0757 10/24/20 1110  BP: (!) 160/73 (!) 170/79  Pulse: 74 79  Resp: 14 15  Temp: 98 F (36.7 C) 97.6 F (36.4 C)  SpO2: 93% 95%   Vitals:   10/24/20 0041 10/24/20 0523 10/24/20 0757 10/24/20 1110  BP: 131/74 (!) 141/73 (!) 160/73 (!) 170/79  Pulse: (!) 57 61 74 79  Resp: 16 15 14 15   Temp: 98.4 F (36.9 C) 98.4 F (36.9 C) 98 F (36.7 C) 97.6 F (36.4 C)  TempSrc: Oral Oral    SpO2: 91% 92% 93% 95%  Weight:      Height:        General: Pt is alert, awake, not in acute distress, chronic, mild left facial droop Cardiovascular: RRR, S1/S2 +, no rubs, no gallops Respiratory: CTA bilaterally, no wheezing, no rhonchi Abdominal: Soft, NT, ND, bowel sounds + Extremities: no edema, no cyanosis    The results of significant diagnostics from this hospitalization (including imaging, microbiology, ancillary and laboratory) are listed below for reference.     Microbiology: Recent Results (from the past 240 hour(s))  Urine culture     Status: Abnormal   Collection Time: 10/14/20  7:47 PM   Specimen: Urine, Random  Result Value Ref Range Status   Specimen Description   Final    URINE, RANDOM Performed at [facility], [address] [place], [place] [address]     Special Requests   Final    NONE Performed at [facility], [place], [place] [address]    Culture 80,000 COLONIES/mL CITROBACTER FREUNDII (A)  Final   Report Status 10/17/2020 FINAL  Final   Organism ID, Bacteria CITROBACTER FREUNDII (A)  Final      Susceptibility   Citrobacter freundii - MIC*    CEFAZOLIN >=64 RESISTANT Resistant     CEFEPIME <=0.12 SENSITIVE Sensitive     CEFTRIAXONE <=0.25 SENSITIVE Sensitive     CIPROFLOXACIN <=0.25 SENSITIVE Sensitive     GENTAMICIN <=1 SENSITIVE Sensitive     IMIPENEM 1 SENSITIVE Sensitive     NITROFURANTOIN 64 INTERMEDIATE Intermediate     TRIMETH/SULFA <=  20 SENSITIVE Sensitive     PIP/TAZO <=4 SENSITIVE Sensitive     * 80,000 COLONIES/mL CITROBACTER FREUNDII  Resp Panel by RT-PCR (Flu A&B, Covid) Nasopharyngeal Swab     Status: None   Collection Time: 10/23/20 12:11 PM   Specimen: Nasopharyngeal Swab; Nasopharyngeal(NP) swabs in vial transport medium  Result Value Ref Range Status   SARS Coronavirus 2 by RT PCR NEGATIVE NEGATIVE Final    Comment: (NOTE) SARS-CoV-2 target nucleic acids are NOT DETECTED.  The SARS-CoV-2 RNA is generally detectable in upper respiratory specimens during the acute phase of infection. The lowest concentration of SARS-CoV-2 viral copies this assay can detect is 138 copies/mL. A negative result does not preclude SARS-Cov-2 infection and should not be used as the sole basis for treatment or other patient management decisions. A negative result may occur with  improper specimen collection/handling, submission of specimen other than nasopharyngeal swab, presence of viral mutation(s) within the areas targeted by this assay, and inadequate number of viral copies(<138 copies/mL). A negative result must be combined with clinical observations, patient history, and epidemiological information. The expected result is Negative.  Fact Sheet for Patients:   EntrepreneurPulse.com.au  Fact Sheet for Healthcare Providers:  IncredibleEmployment.be  This test is no t yet approved or cleared by the [place] FDA and  has been authorized for detection and/or diagnosis of SARS-CoV-2 by FDA under an Emergency Use Authorization (EUA). This EUA will remain  in effect (meaning this test can be used) for the duration of the COVID-19 declaration under Section 564(b)(1) of the Act, 21 U.S.C.section 360bbb-3(b)(1), unless the authorization is terminated  or revoked sooner.       Influenza A by PCR NEGATIVE NEGATIVE Final   Influenza B by PCR NEGATIVE NEGATIVE Final    Comment: (NOTE) The Xpert Xpress SARS-CoV-2/FLU/RSV plus assay is intended as an aid in the diagnosis of influenza from Nasopharyngeal swab specimens and should not be used as a sole basis for treatment. Nasal washings and aspirates are unacceptable for Xpert Xpress SARS-CoV-2/FLU/RSV testing.  Fact Sheet for Patients: EntrepreneurPulse.com.au  Fact Sheet for Healthcare Providers: IncredibleEmployment.be  This test is not yet approved or cleared by the [place] FDA and has been authorized for detection and/or diagnosis of SARS-CoV-2 by FDA under an Emergency Use Authorization (EUA). This EUA will remain in effect (meaning this test can be used) for the duration of the COVID-19 declaration under Section 564(b)(1) of the Act, 21 U.S.C. section 360bbb-3(b)(1), unless the authorization is terminated or revoked.  Performed at [facility], [address] [place], [place] [address]      Labs: BNP (last 3 results) No results for input(s): BNP in the last 8760 hours. Basic Metabolic Panel: Recent Labs  Lab 10/23/20 1152 10/23/20 1755 10/23/20 2313 10/24/20 0450 10/24/20 0701  NA 129* 132* 134* 135 135  K 4.7 4.6 5.0 4.8 4.6  CL 95* 102 104 106 105  CO2 23 21* 21* 21* 22  GLUCOSE  172* 202* 184* 150* 146*  BUN 12 11 12 12 12   CREATININE 1.28* 1.08* 1.04* 1.00 0.99  CALCIUM 9.6 9.2 9.3 9.2 9.0  MG 2.0  --   --   --   --   PHOS  --  3.0  --   --   --    Liver Function Tests: Recent Labs  Lab 10/23/20 1152  AST 66*  ALT 66*  ALKPHOS 48  BILITOT 0.6  PROT 7.3  ALBUMIN 4.1   No results  for input(s): LIPASE, AMYLASE in the last 168 hours. No results for input(s): AMMONIA in the last 168 hours. CBC: Recent Labs  Lab 10/23/20 1152 10/24/20 0450  WBC 6.6 7.6  HGB 15.9* 15.5*  HCT 46.4* 45.3  MCV 89.2 90.1  PLT 267 255   Cardiac Enzymes: No results for input(s): CKTOTAL, CKMB, CKMBINDEX, TROPONINI in the last 168 hours. BNP: Invalid input(s): POCBNP CBG: No results for input(s): GLUCAP in the last 168 hours. D-Dimer No results for input(s): DDIMER in the last 72 hours. Hgb A1c No results for input(s): HGBA1C in the last 72 hours. Lipid Profile No results for input(s): CHOL, HDL, LDLCALC, TRIG, CHOLHDL, LDLDIRECT in the last 72 hours. Thyroid function studies Recent Labs    10/23/20 1152  TSH 0.496   Anemia work up No results for input(s): VITAMINB12, FOLATE, FERRITIN, TIBC, IRON, RETICCTPCT in the last 72 hours. Urinalysis    Component Value Date/Time   COLORURINE YELLOW (A) 10/23/2020 1400   APPEARANCEUR CLEAR (A) 10/23/2020 1400   LABSPEC 1.006 10/23/2020 1400   PHURINE 6.0 10/23/2020 1400   GLUCOSEU NEGATIVE 10/23/2020 1400   HGBUR NEGATIVE 10/23/2020 1400   BILIRUBINUR NEGATIVE 10/23/2020 1400   KETONESUR NEGATIVE 10/23/2020 1400   PROTEINUR NEGATIVE 10/23/2020 1400   NITRITE NEGATIVE 10/23/2020 1400   LEUKOCYTESUR NEGATIVE 10/23/2020 1400   Sepsis Labs Invalid input(s): PROCALCITONIN,  WBC,  LACTICIDVEN Microbiology Recent Results (from the past 240 hour(s))  Urine culture     Status: Abnormal   Collection Time: 10/14/20  7:47 PM   Specimen: Urine, Random  Result Value Ref Range Status   Specimen Description   Final     URINE, RANDOM Performed at [facility], [address] [place], [place] [address]    Special Requests   Final    NONE Performed at [facility], [place], [place] [address]    Culture 80,000 COLONIES/mL CITROBACTER FREUNDII (A)  Final   Report Status 10/17/2020 FINAL  Final   Organism ID, Bacteria CITROBACTER FREUNDII (A)  Final      Susceptibility   Citrobacter freundii - MIC*    CEFAZOLIN >=64 RESISTANT Resistant     CEFEPIME <=0.12 SENSITIVE Sensitive     CEFTRIAXONE <=0.25 SENSITIVE Sensitive     CIPROFLOXACIN <=0.25 SENSITIVE Sensitive     GENTAMICIN <=1 SENSITIVE Sensitive     IMIPENEM 1 SENSITIVE Sensitive     NITROFURANTOIN 64 INTERMEDIATE Intermediate     TRIMETH/SULFA <=20 SENSITIVE Sensitive     PIP/TAZO <=4 SENSITIVE Sensitive     * 80,000 COLONIES/mL CITROBACTER FREUNDII  Resp Panel by RT-PCR (Flu A&B, Covid) Nasopharyngeal Swab     Status: None   Collection Time: 10/23/20 12:11 PM   Specimen: Nasopharyngeal Swab; Nasopharyngeal(NP) swabs in vial transport medium  Result Value Ref Range Status   SARS Coronavirus 2 by RT PCR NEGATIVE NEGATIVE Final    Comment: (NOTE) SARS-CoV-2 target nucleic acids are NOT DETECTED.  The SARS-CoV-2 RNA is generally detectable in upper respiratory specimens during the acute phase of infection. The lowest concentration of SARS-CoV-2 viral copies this assay can detect is 138 copies/mL. A negative result does not preclude SARS-Cov-2 infection and should not be used as the sole basis for treatment or other patient management decisions. A negative result may occur with  improper specimen collection/handling, submission of specimen other than nasopharyngeal swab, presence of viral mutation(s) within the areas targeted by this assay, and inadequate number of viral copies(<138 copies/mL). A negative  result must be combined with clinical observations, patient history, and epidemiological information. The  expected result is Negative.  Fact Sheet for Patients:  EntrepreneurPulse.com.au  Fact Sheet for Healthcare Providers:  IncredibleEmployment.be  This test is no t yet approved or cleared by the [place] FDA and  has been authorized for detection and/or diagnosis of SARS-CoV-2 by FDA under an Emergency Use Authorization (EUA). This EUA will remain  in effect (meaning this test can be used) for the duration of the COVID-19 declaration under Section 564(b)(1) of the Act, 21 U.S.C.section 360bbb-3(b)(1), unless the authorization is terminated  or revoked sooner.       Influenza A by PCR NEGATIVE NEGATIVE Final   Influenza B by PCR NEGATIVE NEGATIVE Final    Comment: (NOTE) The Xpert Xpress SARS-CoV-2/FLU/RSV plus assay is intended as an aid in the diagnosis of influenza from Nasopharyngeal swab specimens and should not be used as a sole basis for treatment. Nasal washings and aspirates are unacceptable for Xpert Xpress SARS-CoV-2/FLU/RSV testing.  Fact Sheet for Patients: EntrepreneurPulse.com.au  Fact Sheet for Healthcare Providers: IncredibleEmployment.be  This test is not yet approved or cleared by the [place] FDA and has been authorized for detection and/or diagnosis of SARS-CoV-2 by FDA under an Emergency Use Authorization (EUA). This EUA will remain in effect (meaning this test can be used) for the duration of the COVID-19 declaration under Section 564(b)(1) of the Act, 21 U.S.C. section 360bbb-3(b)(1), unless the authorization is terminated or revoked.  Performed at [facility], [address], [place], [place] [address]      Time coordinating discharge: Over 30 minutes  SIGNED:   Phillips Climes, MD  [facility] 10/24/2020, 12:17 PM Pager   If 7PM-7AM, please contact night-coverage [email or web address] Password [email or web address]

## 2020-10-24 NOTE — Plan of Care (Signed)
  Problem: Education: Goal: Knowledge of General Education information will improve Description: Including pain rating scale, medication(s)/side effects and non-pharmacologic comfort measures Outcome: Adequate for Discharge   Problem: Health Behavior/Discharge Planning: Goal: Ability to manage health-related needs will improve Outcome: Adequate for Discharge   Problem: Clinical Measurements: Goal: Ability to maintain clinical measurements within normal limits will improve Outcome: Adequate for Discharge Goal: Will remain free from infection Outcome: Adequate for Discharge Goal: Diagnostic test results will improve Outcome: Adequate for Discharge   Problem: Activity: Goal: Risk for activity intolerance will decrease Outcome: Adequate for Discharge   Problem: Nutrition: Goal: Adequate nutrition will be maintained Outcome: Adequate for Discharge   Problem: Coping: Goal: Level of anxiety will decrease Outcome: Adequate for Discharge   Problem: Elimination: Goal: Will not experience complications related to bowel motility Outcome: Adequate for Discharge Goal: Will not experience complications related to urinary retention Outcome: Adequate for Discharge   Problem: Pain Managment: Goal: General experience of comfort will improve Outcome: Adequate for Discharge   Problem: Safety: Goal: Ability to remain free from injury will improve Outcome: Adequate for Discharge   Problem: Skin Integrity: Goal: Risk for impaired skin integrity will decrease Outcome: Adequate for Discharge

## 2020-10-24 NOTE — Discharge Instructions (Signed)
Follow with Primary MD Amy Maltese, MD in 7 days   Get CBC, CMP,checked  by Primary MD next visit.    Activity: As tolerated with Full fall precautions use walker/cane & assistance as needed   Disposition Home    Diet: Heart Healthy  , with feeding assistance and aspiration precautions.  On your next visit with your primary care physician please Get Medicines reviewed and adjusted.   Please request your Prim.MD to go over all Hospital Tests and Procedure/Radiological results at the follow up, please get all Hospital records sent to your Prim MD by signing hospital release before you go home.   If you experience worsening of your admission symptoms, develop shortness of breath, life threatening emergency, suicidal or homicidal thoughts you must seek medical attention immediately by calling 911 or calling your MD immediately  if symptoms less severe.  You Must read complete instructions/literature along with all the possible adverse reactions/side effects for all the Medicines you take and that have been prescribed to you. Take any new Medicines after you have completely understood and accpet all the possible adverse reactions/side effects.   Do not drive, operating heavy machinery, perform activities at heights, swimming or participation in water activities or provide baby sitting services if your were admitted for syncope or siezures until you have seen by Primary MD or a Neurologist and advised to do so again.  Do not drive when taking Pain medications.    Do not take more than prescribed Pain, Sleep and Anxiety Medications  Special Instructions: If you have smoked or chewed Tobacco  in the last 2 yrs please stop smoking, stop any regular Alcohol  and or any Recreational drug use.  Wear Seat belts while driving.   Please note  You were cared for by a hospitalist during your hospital stay. If you have any questions about your discharge medications or the care you received  while you were in the hospital after you are discharged, you can call the unit and asked to speak with the hospitalist on call if the hospitalist that took care of you is not available. Once you are discharged, your primary care physician will handle any further medical issues. Please note that NO REFILLS for any discharge medications will be authorized once you are discharged, as it is imperative that you return to your primary care physician (or establish a relationship with a primary care physician if you do not have one) for your aftercare needs so that they can reassess your need for medications and monitor your lab values.

## 2020-10-24 NOTE — Evaluation (Addendum)
Physical Therapy Evaluation Patient Details Name: Amy Roth MRN: [id number] DOB: [birth year] Today's Date: 10/24/2020   History of Present Illness  Patient is a  87 y.o. female with medical history significant of hypertension, hyperlipidemia, stroke, GERD, hypothyroidism, depression, remote left leg DVT not on anticoagulants, dysrhythmia who presents with weakness for the last 2 weeks. Found to have generalized weakness with likely multifactorial etiology, including hyponatremia, AKI, recent UTI. CT of head reports no acute finding or explanation for symptoms    Clinical Impression  Patient agreeable to PT evaluation and is hopeful to discharge home today. No physical assistance is required with mobility efforts. Modified Independent with bed mobility, transfers, and ambulation. Patient does demonstrate improved gait pattern and balance with rolling walker versus no assistive device however reports she is unlikely to use the walker at home. Family at bedside state that family is available for assistance as needed at home. Patient educated on energy conservation techniques to be used at home. Patient is likely closer to her baseline level of functional mobility. PT will follow up if patient remains in the hospital for her generalized weakness, but she would safe to discharge home with family. HHPT recommended also, however patient declined.      Follow Up Recommendations Home health PT;Supervision - Intermittent (patient declined need for home health)    Equipment Recommendations  Rolling walker with 5" wheels (patient and family declined (state they can borrow a walker if needed))    Recommendations for Other Services       Precautions / Restrictions Precautions Precautions: Fall Restrictions Weight Bearing Restrictions: No      Mobility  Bed Mobility Overal bed mobility: Modified Independent Bed Mobility: Supine to Sit;Sit to Supine     Supine to sit: Modified  independent (Device/Increase time) Sit to supine: Modified independent (Device/Increase time)   General bed mobility comments: no physical assistance required for bed mobility    Transfers   Equipment used: None Transfers: Sit to/from Stand Sit to Stand: Modified independent (Device/Increase time)         General transfer comment: no difficulty with standing from bed and from toilet  Ambulation/Gait Ambulation/Gait assistance: Supervision Gait Distance (Feet): 50 Feet Assistive device: Rolling walker (2 wheeled) (with and without rolling walker) Gait Pattern/deviations: Step-through pattern;Decreased stride length Gait velocity: decreased   General Gait Details: patient has decreased step length and one bout of unsteadiness that is self corrected with use of rollnig walker. With verbal cues proper use and placement of rolling walker, balance and gait pattern improved.  Stairs            Wheelchair Mobility    Modified Rankin (Stroke Patients Only)       Balance Overall balance assessment: Needs assistance Sitting-balance support: Feet supported;No upper extremity supported Sitting balance-Leahy Scale: Normal     Standing balance support: No upper extremity supported Standing balance-Leahy Scale: Fair                               Pertinent Vitals/Pain Pain Assessment: No/denies pain    Home Living Family/patient expects to be discharged to:: Private residence Living Arrangements: Alone Available Help at Discharge: Family;Available PRN/intermittently Type of Home: House Home Access:  (one step up with railing)     Home Layout: One level Home Equipment: None      Prior Function Level of Independence: Independent         Comments: Patient drives  occasionally. [employer] delivers one meal per day. Family frequently checks on patient     Hand Dominance        Extremity/Trunk Assessment   Upper Extremity Assessment Upper  Extremity Assessment: Generalized weakness    Lower Extremity Assessment Lower Extremity Assessment: Generalized weakness       Communication   Communication: No difficulties  Cognition Arousal/Alertness: Awake/alert Behavior During Therapy: WFL for tasks assessed/performed Overall Cognitive Status: Within Functional Limits for tasks assessed                                        General Comments      Exercises     Assessment/Plan    PT Assessment Patient needs continued PT services  PT Problem List Decreased strength;Decreased balance;Decreased activity tolerance;Decreased mobility;Decreased safety awareness;Decreased knowledge of precautions;Decreased knowledge of use of DME       PT Treatment Interventions Gait training;DME instruction;Stair training;Functional mobility training;Therapeutic activities;Therapeutic exercise;Balance training;Patient/family education    PT Goals (Current goals can be found in the Care Plan section)  Acute Rehab PT Goals Patient Stated Goal: to go home PT Goal Formulation: With patient Time For Goal Achievement: 11/07/20 Potential to Achieve Goals: Good    Frequency Min 2X/week   Barriers to discharge        Co-evaluation               AM-PAC PT "6 Clicks" Mobility  Outcome Measure Help needed turning from your back to your side while in a flat bed without using bedrails?: None Help needed moving from lying on your back to sitting on the side of a flat bed without using bedrails?: None Help needed moving to and from a bed to a chair (including a wheelchair)?: None Help needed standing up from a chair using your arms (e.g., wheelchair or bedside chair)?: A Little Help needed to walk in hospital room?: A Little Help needed climbing 3-5 steps with a railing? : A Little 6 Click Score: 21    End of Session   Activity Tolerance: Patient tolerated treatment well Patient left: in bed;with call bell/phone within  reach;with bed alarm set;with nursing/sitter in room Nurse Communication: Mobility status PT Visit Diagnosis: Unsteadiness on feet (R26.81);Muscle weakness (generalized) (M62.81)    Time: 1101-1130 PT Time Calculation (min) (ACUTE ONLY): 29 min   Charges:   PT Evaluation $PT Eval Low Complexity: 1 Low PT Treatments $Gait Training: 8-22 mins        Minna Merritts, PT, MPT   Percell Locus 10/24/2020, 11:41 AM

## 2020-11-05 DIAGNOSIS — I10 Essential (primary) hypertension: Secondary | ICD-10-CM | POA: Diagnosis not present

## 2020-11-05 DIAGNOSIS — E860 Dehydration: Secondary | ICD-10-CM | POA: Diagnosis not present

## 2020-11-05 DIAGNOSIS — E222 Syndrome of inappropriate secretion of antidiuretic hormone: Secondary | ICD-10-CM | POA: Diagnosis not present

## 2020-11-15 DIAGNOSIS — H02051 Trichiasis without entropian right upper eyelid: Secondary | ICD-10-CM | POA: Diagnosis not present

## 2020-11-15 DIAGNOSIS — H353211 Exudative age-related macular degeneration, right eye, with active choroidal neovascularization: Secondary | ICD-10-CM | POA: Diagnosis not present

## 2020-11-18 DIAGNOSIS — N390 Urinary tract infection, site not specified: Secondary | ICD-10-CM | POA: Diagnosis not present

## 2020-11-18 DIAGNOSIS — E222 Syndrome of inappropriate secretion of antidiuretic hormone: Secondary | ICD-10-CM | POA: Diagnosis not present

## 2020-11-18 DIAGNOSIS — R42 Dizziness and giddiness: Secondary | ICD-10-CM | POA: Diagnosis not present

## 2020-11-18 DIAGNOSIS — E039 Hypothyroidism, unspecified: Secondary | ICD-10-CM | POA: Diagnosis not present

## 2020-11-18 DIAGNOSIS — E860 Dehydration: Secondary | ICD-10-CM | POA: Diagnosis not present

## 2020-11-18 DIAGNOSIS — G252 Other specified forms of tremor: Secondary | ICD-10-CM | POA: Diagnosis not present

## 2020-11-18 DIAGNOSIS — I10 Essential (primary) hypertension: Secondary | ICD-10-CM | POA: Diagnosis not present

## 2020-11-19 DIAGNOSIS — R42 Dizziness and giddiness: Secondary | ICD-10-CM | POA: Diagnosis not present

## 2020-11-19 DIAGNOSIS — N390 Urinary tract infection, site not specified: Secondary | ICD-10-CM | POA: Diagnosis not present

## 2020-11-19 DIAGNOSIS — E860 Dehydration: Secondary | ICD-10-CM | POA: Diagnosis not present

## 2020-11-23 ENCOUNTER — Other Ambulatory Visit: Payer: Self-pay | Admitting: Family

## 2020-11-23 DIAGNOSIS — R251 Tremor, unspecified: Secondary | ICD-10-CM

## 2020-11-23 DIAGNOSIS — E222 Syndrome of inappropriate secretion of antidiuretic hormone: Secondary | ICD-10-CM

## 2020-11-23 DIAGNOSIS — R42 Dizziness and giddiness: Secondary | ICD-10-CM

## 2020-12-02 DIAGNOSIS — N390 Urinary tract infection, site not specified: Secondary | ICD-10-CM | POA: Diagnosis not present

## 2020-12-02 DIAGNOSIS — E222 Syndrome of inappropriate secretion of antidiuretic hormone: Secondary | ICD-10-CM | POA: Diagnosis not present

## 2020-12-04 ENCOUNTER — Ambulatory Visit: Payer: Medicare Other

## 2020-12-08 ENCOUNTER — Other Ambulatory Visit: Payer: Self-pay | Admitting: Family

## 2020-12-08 DIAGNOSIS — H02054 Trichiasis without entropian left upper eyelid: Secondary | ICD-10-CM | POA: Diagnosis not present

## 2020-12-08 DIAGNOSIS — R42 Dizziness and giddiness: Secondary | ICD-10-CM

## 2020-12-08 DIAGNOSIS — E222 Syndrome of inappropriate secretion of antidiuretic hormone: Secondary | ICD-10-CM

## 2020-12-08 DIAGNOSIS — R251 Tremor, unspecified: Secondary | ICD-10-CM

## 2020-12-16 DIAGNOSIS — I10 Essential (primary) hypertension: Secondary | ICD-10-CM | POA: Diagnosis not present

## 2020-12-16 DIAGNOSIS — F32A Depression, unspecified: Secondary | ICD-10-CM | POA: Diagnosis not present

## 2020-12-16 DIAGNOSIS — N390 Urinary tract infection, site not specified: Secondary | ICD-10-CM | POA: Diagnosis not present

## 2020-12-16 DIAGNOSIS — E039 Hypothyroidism, unspecified: Secondary | ICD-10-CM | POA: Diagnosis not present

## 2020-12-27 DIAGNOSIS — H353211 Exudative age-related macular degeneration, right eye, with active choroidal neovascularization: Secondary | ICD-10-CM | POA: Diagnosis not present

## 2021-01-07 DIAGNOSIS — N390 Urinary tract infection, site not specified: Secondary | ICD-10-CM | POA: Diagnosis not present

## 2021-01-07 DIAGNOSIS — R300 Dysuria: Secondary | ICD-10-CM | POA: Diagnosis not present

## 2021-01-07 DIAGNOSIS — E559 Vitamin D deficiency, unspecified: Secondary | ICD-10-CM | POA: Diagnosis not present

## 2021-01-07 DIAGNOSIS — E782 Mixed hyperlipidemia: Secondary | ICD-10-CM | POA: Diagnosis not present

## 2021-01-13 DIAGNOSIS — N390 Urinary tract infection, site not specified: Secondary | ICD-10-CM | POA: Diagnosis not present

## 2021-01-27 DIAGNOSIS — H02051 Trichiasis without entropian right upper eyelid: Secondary | ICD-10-CM | POA: Diagnosis not present

## 2021-01-27 DIAGNOSIS — H02052 Trichiasis without entropian right lower eyelid: Secondary | ICD-10-CM | POA: Diagnosis not present

## 2021-02-04 DIAGNOSIS — I10 Essential (primary) hypertension: Secondary | ICD-10-CM | POA: Diagnosis not present

## 2021-02-04 DIAGNOSIS — M5440 Lumbago with sciatica, unspecified side: Secondary | ICD-10-CM | POA: Diagnosis not present

## 2021-02-04 DIAGNOSIS — E559 Vitamin D deficiency, unspecified: Secondary | ICD-10-CM | POA: Diagnosis not present

## 2021-02-04 DIAGNOSIS — R300 Dysuria: Secondary | ICD-10-CM | POA: Diagnosis not present

## 2021-02-04 DIAGNOSIS — R7302 Impaired glucose tolerance (oral): Secondary | ICD-10-CM | POA: Diagnosis not present

## 2021-02-04 DIAGNOSIS — E782 Mixed hyperlipidemia: Secondary | ICD-10-CM | POA: Diagnosis not present

## 2021-02-14 ENCOUNTER — Other Ambulatory Visit: Payer: Self-pay | Admitting: Nephrology

## 2021-02-14 DIAGNOSIS — R8290 Unspecified abnormal findings in urine: Secondary | ICD-10-CM | POA: Diagnosis not present

## 2021-02-14 DIAGNOSIS — I10 Essential (primary) hypertension: Secondary | ICD-10-CM | POA: Diagnosis not present

## 2021-02-14 DIAGNOSIS — E871 Hypo-osmolality and hyponatremia: Secondary | ICD-10-CM | POA: Insufficient documentation

## 2021-02-14 DIAGNOSIS — N1831 Chronic kidney disease, stage 3a: Secondary | ICD-10-CM

## 2021-02-21 DIAGNOSIS — H353211 Exudative age-related macular degeneration, right eye, with active choroidal neovascularization: Secondary | ICD-10-CM | POA: Diagnosis not present

## 2021-03-01 ENCOUNTER — Ambulatory Visit
Admission: RE | Admit: 2021-03-01 | Discharge: 2021-03-01 | Disposition: A | Discharge status: Home / Self Care | Payer: Medicare Other | Source: Ambulatory Visit | Attending: Nephrology | Admitting: Nephrology

## 2021-03-01 ENCOUNTER — Other Ambulatory Visit: Payer: Self-pay

## 2021-03-01 DIAGNOSIS — N1831 Chronic kidney disease, stage 3a: Secondary | ICD-10-CM | POA: Insufficient documentation

## 2021-03-01 DIAGNOSIS — R8290 Unspecified abnormal findings in urine: Secondary | ICD-10-CM | POA: Diagnosis not present

## 2021-03-01 DIAGNOSIS — N183 Chronic kidney disease, stage 3 unspecified: Secondary | ICD-10-CM | POA: Diagnosis not present

## 2021-03-01 DIAGNOSIS — I10 Essential (primary) hypertension: Secondary | ICD-10-CM | POA: Diagnosis not present

## 2021-03-10 DIAGNOSIS — H02055 Trichiasis without entropian left lower eyelid: Secondary | ICD-10-CM | POA: Diagnosis not present

## 2021-03-18 DIAGNOSIS — F32A Depression, unspecified: Secondary | ICD-10-CM | POA: Diagnosis not present

## 2021-03-18 DIAGNOSIS — R300 Dysuria: Secondary | ICD-10-CM | POA: Diagnosis not present

## 2021-03-18 DIAGNOSIS — E782 Mixed hyperlipidemia: Secondary | ICD-10-CM | POA: Diagnosis not present

## 2021-03-18 DIAGNOSIS — R7302 Impaired glucose tolerance (oral): Secondary | ICD-10-CM | POA: Diagnosis not present

## 2021-03-18 DIAGNOSIS — R7303 Prediabetes: Secondary | ICD-10-CM | POA: Diagnosis not present

## 2021-03-18 DIAGNOSIS — I10 Essential (primary) hypertension: Secondary | ICD-10-CM | POA: Diagnosis not present

## 2021-03-18 DIAGNOSIS — E559 Vitamin D deficiency, unspecified: Secondary | ICD-10-CM | POA: Diagnosis not present

## 2021-03-18 DIAGNOSIS — E039 Hypothyroidism, unspecified: Secondary | ICD-10-CM | POA: Diagnosis not present

## 2021-03-21 DIAGNOSIS — N1831 Chronic kidney disease, stage 3a: Secondary | ICD-10-CM | POA: Diagnosis not present

## 2021-03-21 DIAGNOSIS — E871 Hypo-osmolality and hyponatremia: Secondary | ICD-10-CM | POA: Diagnosis not present

## 2021-03-21 DIAGNOSIS — I10 Essential (primary) hypertension: Secondary | ICD-10-CM | POA: Diagnosis not present

## 2021-04-04 DIAGNOSIS — N1831 Chronic kidney disease, stage 3a: Secondary | ICD-10-CM | POA: Diagnosis not present

## 2021-04-04 DIAGNOSIS — E871 Hypo-osmolality and hyponatremia: Secondary | ICD-10-CM | POA: Diagnosis not present

## 2021-04-04 DIAGNOSIS — I10 Essential (primary) hypertension: Secondary | ICD-10-CM | POA: Diagnosis not present

## 2021-04-28 DIAGNOSIS — H02055 Trichiasis without entropian left lower eyelid: Secondary | ICD-10-CM | POA: Diagnosis not present

## 2021-05-02 DIAGNOSIS — E871 Hypo-osmolality and hyponatremia: Secondary | ICD-10-CM | POA: Diagnosis not present

## 2021-05-02 DIAGNOSIS — N1831 Chronic kidney disease, stage 3a: Secondary | ICD-10-CM | POA: Diagnosis not present

## 2021-05-02 DIAGNOSIS — I10 Essential (primary) hypertension: Secondary | ICD-10-CM | POA: Diagnosis not present

## 2021-06-17 DIAGNOSIS — E782 Mixed hyperlipidemia: Secondary | ICD-10-CM | POA: Diagnosis not present

## 2021-06-17 DIAGNOSIS — I10 Essential (primary) hypertension: Secondary | ICD-10-CM | POA: Diagnosis not present

## 2021-06-17 DIAGNOSIS — E039 Hypothyroidism, unspecified: Secondary | ICD-10-CM | POA: Diagnosis not present

## 2021-06-17 DIAGNOSIS — H353211 Exudative age-related macular degeneration, right eye, with active choroidal neovascularization: Secondary | ICD-10-CM | POA: Diagnosis not present

## 2021-06-17 DIAGNOSIS — D492 Neoplasm of unspecified behavior of bone, soft tissue, and skin: Secondary | ICD-10-CM | POA: Diagnosis not present

## 2021-06-17 DIAGNOSIS — R7302 Impaired glucose tolerance (oral): Secondary | ICD-10-CM | POA: Diagnosis not present

## 2021-06-17 DIAGNOSIS — R7303 Prediabetes: Secondary | ICD-10-CM | POA: Diagnosis not present

## 2021-06-17 DIAGNOSIS — E559 Vitamin D deficiency, unspecified: Secondary | ICD-10-CM | POA: Diagnosis not present

## 2021-06-17 DIAGNOSIS — D485 Neoplasm of uncertain behavior of skin: Secondary | ICD-10-CM | POA: Diagnosis not present

## 2021-06-20 DIAGNOSIS — C44721 Squamous cell carcinoma of skin of unspecified lower limb, including hip: Secondary | ICD-10-CM | POA: Diagnosis not present

## 2021-06-20 DIAGNOSIS — D485 Neoplasm of uncertain behavior of skin: Secondary | ICD-10-CM | POA: Diagnosis not present

## 2021-07-19 DIAGNOSIS — H02054 Trichiasis without entropian left upper eyelid: Secondary | ICD-10-CM | POA: Diagnosis not present

## 2021-08-18 DIAGNOSIS — C44729 Squamous cell carcinoma of skin of left lower limb, including hip: Secondary | ICD-10-CM | POA: Diagnosis not present

## 2021-08-18 DIAGNOSIS — L988 Other specified disorders of the skin and subcutaneous tissue: Secondary | ICD-10-CM | POA: Diagnosis not present

## 2021-08-19 DIAGNOSIS — H02054 Trichiasis without entropian left upper eyelid: Secondary | ICD-10-CM | POA: Diagnosis not present

## 2021-08-24 DIAGNOSIS — R300 Dysuria: Secondary | ICD-10-CM | POA: Diagnosis not present

## 2021-08-24 DIAGNOSIS — N390 Urinary tract infection, site not specified: Secondary | ICD-10-CM | POA: Diagnosis not present

## 2021-08-26 DIAGNOSIS — N390 Urinary tract infection, site not specified: Secondary | ICD-10-CM | POA: Diagnosis not present

## 2021-08-26 DIAGNOSIS — L0390 Cellulitis, unspecified: Secondary | ICD-10-CM | POA: Diagnosis not present

## 2021-08-29 DIAGNOSIS — I10 Essential (primary) hypertension: Secondary | ICD-10-CM | POA: Diagnosis not present

## 2021-08-30 IMAGING — MG DIGITAL SCREENING BILAT W/ TOMO W/ CAD
8 series · 8 of 24 positions shown · non-contrast
Comparison: Previous exam(s).

ACR Breast Density Category a: The breast tissue is almost entirely
fatty.

CLINICAL DATA: Screening.

EXAM:
DIGITAL SCREENING BILATERAL MAMMOGRAM WITH TOMO AND CAD

[R CC synth-2D]
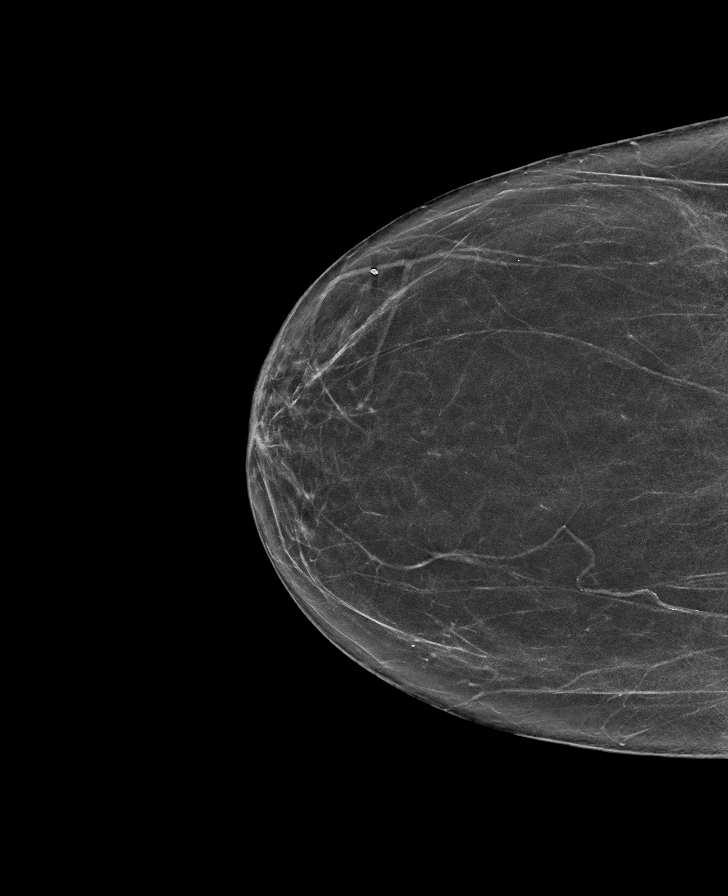

[L CC synth-2D]
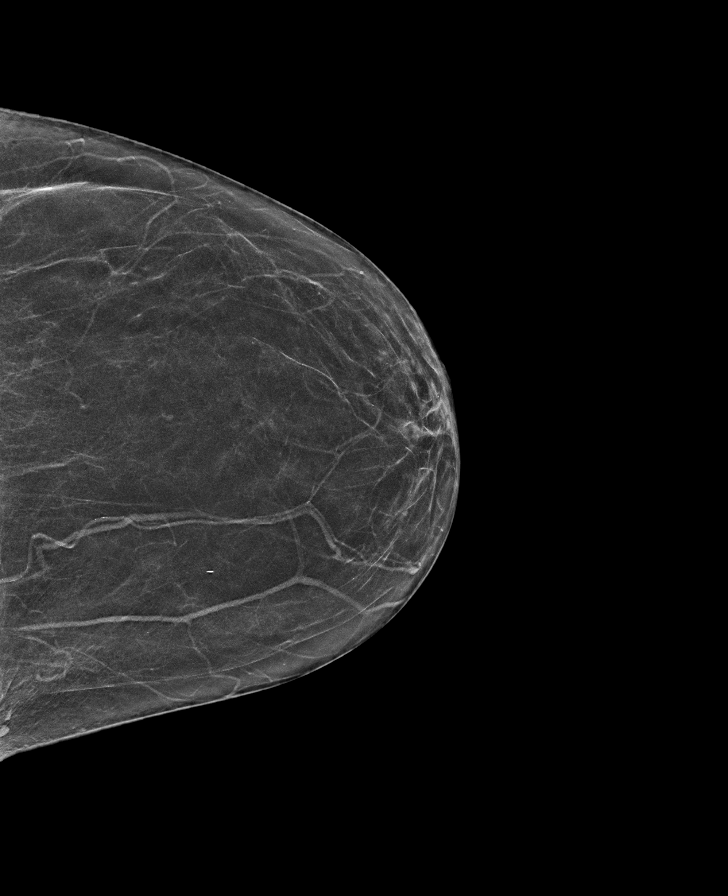

[R MLO synth-2D]
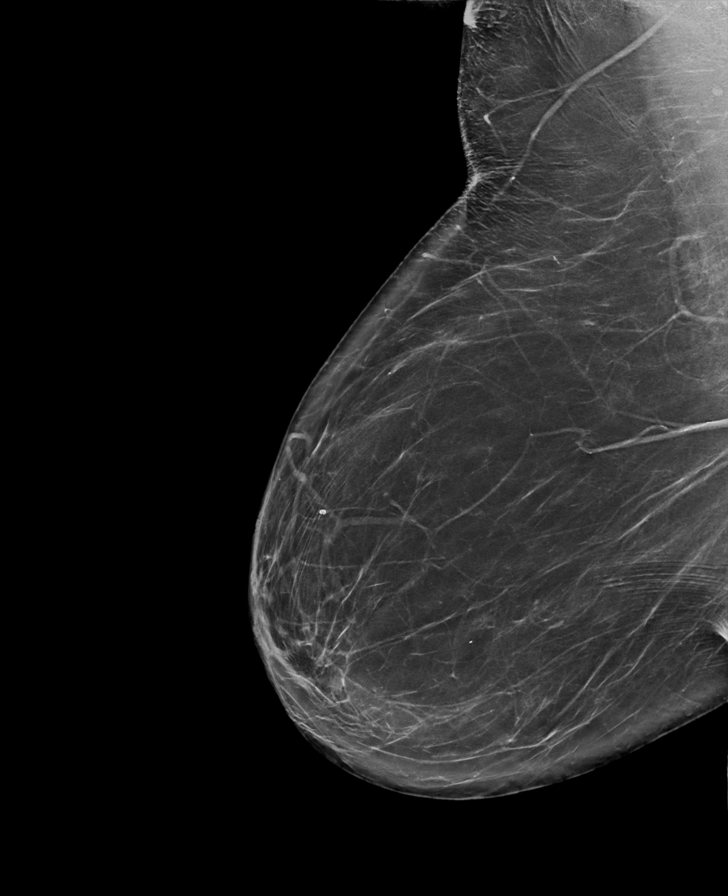

[L MLO synth-2D]
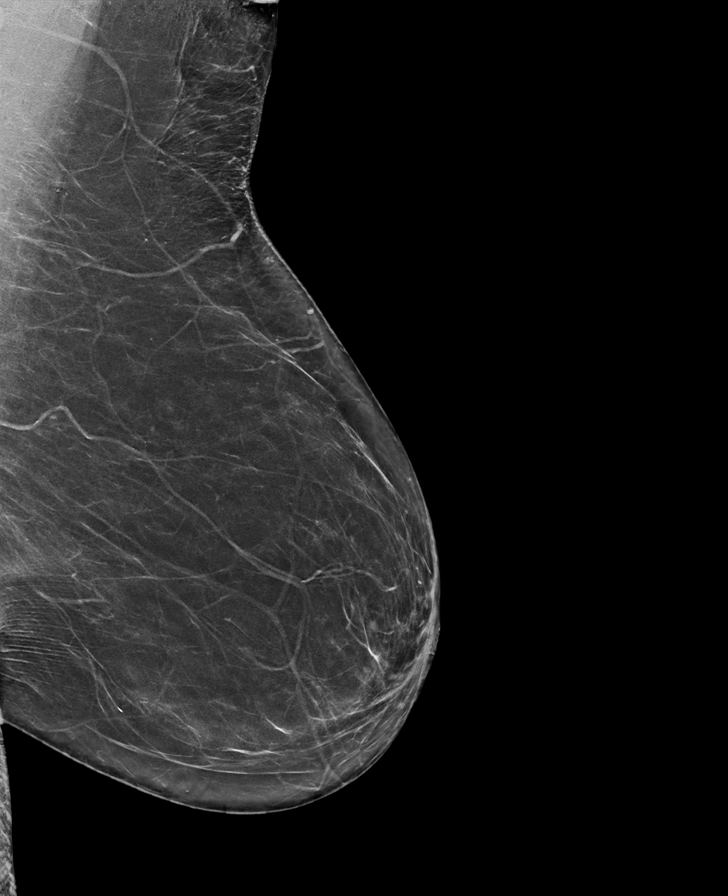

[L MLO tomo · tomo slice 35/70.0]
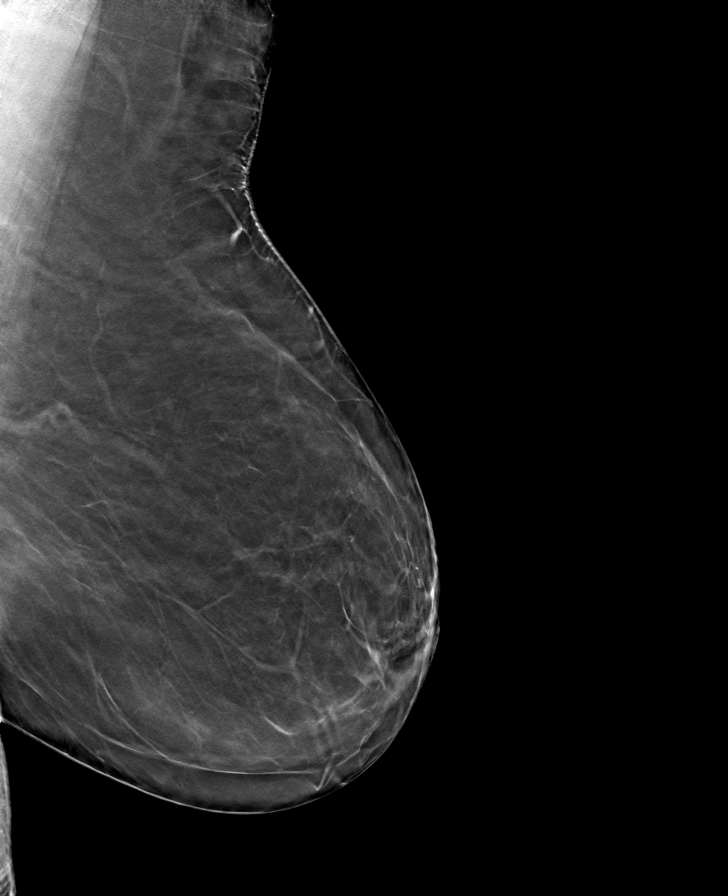

[R CC tomo · tomo slice 33/65.0]
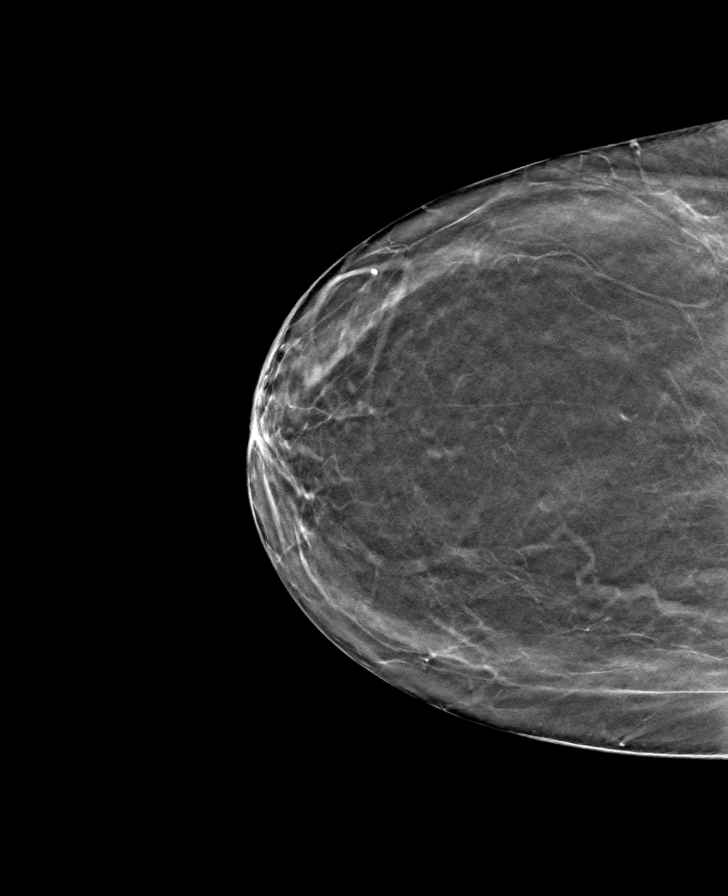

[L CC tomo · tomo slice 30/59.0]
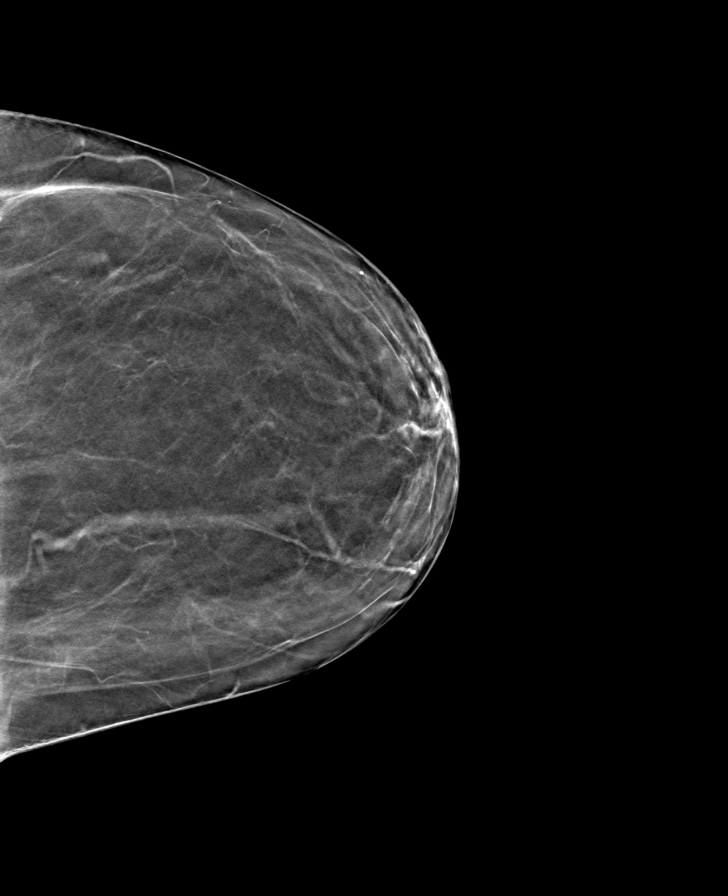

[R MLO tomo · tomo slice 43/84.0]
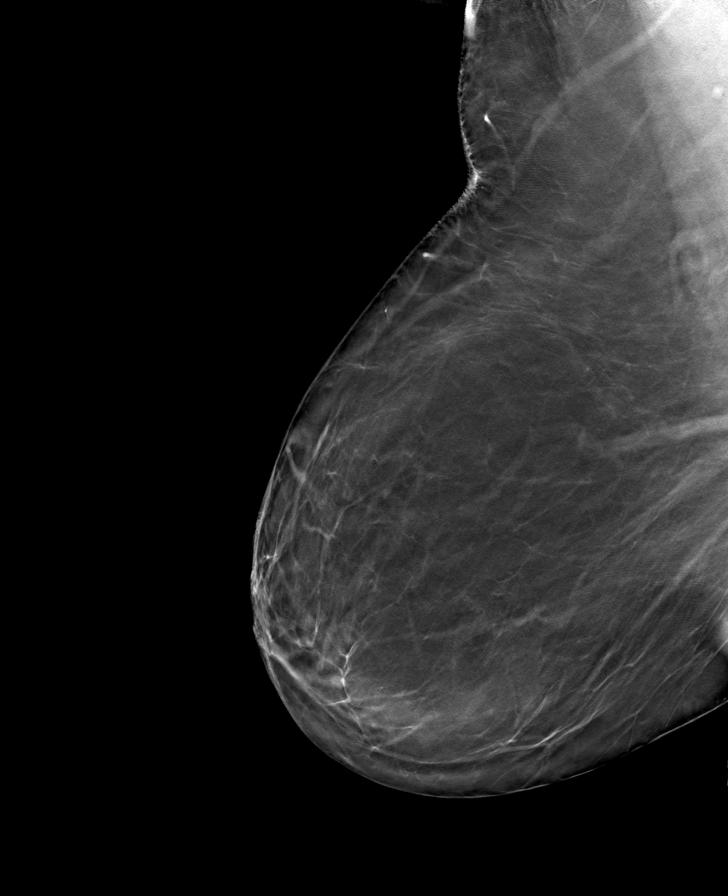

[8 of 24 positions shown; findings below may reference images not displayed]

FINDINGS: There are no findings suspicious for malignancy. Images were
processed with CAD.
IMPRESSION: No mammographic evidence of malignancy. A result letter of this
screening mammogram will be mailed directly to the patient.

RECOMMENDATION:
Screening mammogram in one year. (Code:[id number])

BI-RADS CATEGORY  1: Negative.

## 2021-09-05 DIAGNOSIS — N390 Urinary tract infection, site not specified: Secondary | ICD-10-CM | POA: Diagnosis not present

## 2021-09-05 DIAGNOSIS — R5383 Other fatigue: Secondary | ICD-10-CM | POA: Diagnosis not present

## 2021-09-05 DIAGNOSIS — E039 Hypothyroidism, unspecified: Secondary | ICD-10-CM | POA: Diagnosis not present

## 2021-09-05 DIAGNOSIS — E559 Vitamin D deficiency, unspecified: Secondary | ICD-10-CM | POA: Diagnosis not present

## 2021-09-05 DIAGNOSIS — F32A Depression, unspecified: Secondary | ICD-10-CM | POA: Diagnosis not present

## 2021-09-05 DIAGNOSIS — E782 Mixed hyperlipidemia: Secondary | ICD-10-CM | POA: Diagnosis not present

## 2021-09-15 DIAGNOSIS — R5383 Other fatigue: Secondary | ICD-10-CM | POA: Diagnosis not present

## 2021-09-15 DIAGNOSIS — N390 Urinary tract infection, site not specified: Secondary | ICD-10-CM | POA: Diagnosis not present

## 2021-09-15 DIAGNOSIS — E559 Vitamin D deficiency, unspecified: Secondary | ICD-10-CM | POA: Diagnosis not present

## 2021-09-15 DIAGNOSIS — E039 Hypothyroidism, unspecified: Secondary | ICD-10-CM | POA: Diagnosis not present

## 2021-10-17 DIAGNOSIS — H02005 Unspecified entropion of left lower eyelid: Secondary | ICD-10-CM | POA: Diagnosis not present

## 2021-10-17 DIAGNOSIS — H02004 Unspecified entropion of left upper eyelid: Secondary | ICD-10-CM | POA: Diagnosis not present

## 2021-12-05 DIAGNOSIS — R300 Dysuria: Secondary | ICD-10-CM | POA: Diagnosis not present

## 2021-12-05 DIAGNOSIS — N390 Urinary tract infection, site not specified: Secondary | ICD-10-CM | POA: Diagnosis not present

## 2021-12-15 DIAGNOSIS — R300 Dysuria: Secondary | ICD-10-CM | POA: Diagnosis not present

## 2021-12-20 DIAGNOSIS — H02004 Unspecified entropion of left upper eyelid: Secondary | ICD-10-CM | POA: Diagnosis not present

## 2021-12-30 DIAGNOSIS — F32A Depression, unspecified: Secondary | ICD-10-CM | POA: Diagnosis not present

## 2021-12-30 DIAGNOSIS — R7303 Prediabetes: Secondary | ICD-10-CM | POA: Diagnosis not present

## 2021-12-30 DIAGNOSIS — N390 Urinary tract infection, site not specified: Secondary | ICD-10-CM | POA: Diagnosis not present

## 2021-12-30 DIAGNOSIS — E559 Vitamin D deficiency, unspecified: Secondary | ICD-10-CM | POA: Diagnosis not present

## 2021-12-30 DIAGNOSIS — I10 Essential (primary) hypertension: Secondary | ICD-10-CM | POA: Diagnosis not present

## 2021-12-30 DIAGNOSIS — E039 Hypothyroidism, unspecified: Secondary | ICD-10-CM | POA: Diagnosis not present

## 2021-12-30 DIAGNOSIS — E782 Mixed hyperlipidemia: Secondary | ICD-10-CM | POA: Diagnosis not present

## 2021-12-30 DIAGNOSIS — R300 Dysuria: Secondary | ICD-10-CM | POA: Diagnosis not present

## 2022-02-02 ENCOUNTER — Inpatient Hospital Stay (HOSPITAL_COMMUNITY)
Admission: EM | Admit: 2022-02-02 | Discharge: 2022-02-04 | DRG: 641 | Disposition: A | Discharge status: Home Health | Payer: Medicare Other | Attending: Family Medicine | Admitting: Family Medicine

## 2022-02-02 ENCOUNTER — Encounter (HOSPITAL_COMMUNITY): Payer: Self-pay

## 2022-02-02 ENCOUNTER — Emergency Department (HOSPITAL_COMMUNITY): Payer: Medicare Other

## 2022-02-02 ENCOUNTER — Other Ambulatory Visit: Payer: Self-pay

## 2022-02-02 DIAGNOSIS — R739 Hyperglycemia, unspecified: Secondary | ICD-10-CM

## 2022-02-02 DIAGNOSIS — R4182 Altered mental status, unspecified: Secondary | ICD-10-CM | POA: Diagnosis present

## 2022-02-02 DIAGNOSIS — E871 Hypo-osmolality and hyponatremia: Principal | ICD-10-CM

## 2022-02-02 DIAGNOSIS — E860 Dehydration: Secondary | ICD-10-CM

## 2022-02-02 DIAGNOSIS — R410 Disorientation, unspecified: Secondary | ICD-10-CM | POA: Diagnosis not present

## 2022-02-02 DIAGNOSIS — Z791 Long term (current) use of non-steroidal anti-inflammatories (NSAID): Secondary | ICD-10-CM

## 2022-02-02 DIAGNOSIS — E039 Hypothyroidism, unspecified: Secondary | ICD-10-CM | POA: Diagnosis not present

## 2022-02-02 DIAGNOSIS — R531 Weakness: Secondary | ICD-10-CM | POA: Diagnosis not present

## 2022-02-02 DIAGNOSIS — R29810 Facial weakness: Secondary | ICD-10-CM | POA: Diagnosis present

## 2022-02-02 DIAGNOSIS — E785 Hyperlipidemia, unspecified: Secondary | ICD-10-CM | POA: Diagnosis not present

## 2022-02-02 DIAGNOSIS — R29818 Other symptoms and signs involving the nervous system: Secondary | ICD-10-CM | POA: Diagnosis not present

## 2022-02-02 DIAGNOSIS — F419 Anxiety disorder, unspecified: Secondary | ICD-10-CM | POA: Diagnosis present

## 2022-02-02 DIAGNOSIS — I10 Essential (primary) hypertension: Secondary | ICD-10-CM | POA: Diagnosis not present

## 2022-02-02 DIAGNOSIS — F0390 Unspecified dementia without behavioral disturbance: Secondary | ICD-10-CM | POA: Diagnosis present

## 2022-02-02 DIAGNOSIS — Z7982 Long term (current) use of aspirin: Secondary | ICD-10-CM

## 2022-02-02 DIAGNOSIS — G8190 Hemiplegia, unspecified affecting unspecified side: Secondary | ICD-10-CM | POA: Diagnosis not present

## 2022-02-02 DIAGNOSIS — Z20822 Contact with and (suspected) exposure to covid-19: Secondary | ICD-10-CM | POA: Diagnosis not present

## 2022-02-02 DIAGNOSIS — Z79899 Other long term (current) drug therapy: Secondary | ICD-10-CM

## 2022-02-02 DIAGNOSIS — Z66 Do not resuscitate: Secondary | ICD-10-CM | POA: Diagnosis present

## 2022-02-02 DIAGNOSIS — Z87440 Personal history of urinary (tract) infections: Secondary | ICD-10-CM

## 2022-02-02 DIAGNOSIS — Z79890 Hormone replacement therapy (postmenopausal): Secondary | ICD-10-CM | POA: Diagnosis not present

## 2022-02-02 DIAGNOSIS — I6782 Cerebral ischemia: Secondary | ICD-10-CM | POA: Diagnosis not present

## 2022-02-02 HISTORY — DX: Essential (primary) hypertension: I10

## 2022-02-02 LAB — COMPREHENSIVE METABOLIC PANEL
ALT: 52 U/L — ABNORMAL HIGH (ref 0–44)
AST: 41 U/L (ref 15–41)
Albumin: 3.9 g/dL (ref 3.5–5.0)
Alkaline Phosphatase: 62 U/L (ref 38–126)
Anion gap: 17 — ABNORMAL HIGH (ref 5–15)
BUN: 8 mg/dL (ref 8–23)
CO2: 20 mmol/L — ABNORMAL LOW (ref 22–32)
Calcium: 8.9 mg/dL (ref 8.9–10.3)
Chloride: 86 mmol/L — ABNORMAL LOW (ref 98–111)
Creatinine, Ser: 0.88 mg/dL (ref 0.44–1.00)
GFR, Estimated: >60 mL/min (ref >60)
Glucose, Bld: 248 mg/dL — ABNORMAL HIGH (ref 70–99)
Potassium: 3.2 mmol/L — ABNORMAL LOW (ref 3.5–5.1)
Sodium: 123 mmol/L — ABNORMAL LOW (ref 135–145)
Total Bilirubin: 1.3 mg/dL — ABNORMAL HIGH (ref 0.3–1.2)
Total Protein: 6.9 g/dL (ref 6.5–8.1)

## 2022-02-02 LAB — URINALYSIS, ROUTINE W REFLEX MICROSCOPIC
Bilirubin Urine: NEGATIVE
Glucose, UA: 150 mg/dL — AB
Hgb urine dipstick: NEGATIVE
Ketones, ur: 20 mg/dL — AB
Leukocytes,Ua: NEGATIVE
Nitrite: NEGATIVE
Protein, ur: NEGATIVE mg/dL
Specific Gravity, Urine: 1.006 (ref 1.005–1.030)
pH: 7 (ref 5.0–8.0)

## 2022-02-02 LAB — RAPID URINE DRUG SCREEN, HOSP PERFORMED
Amphetamines: NOT DETECTED
Barbiturates: NOT DETECTED
Benzodiazepines: NOT DETECTED
Cocaine: NOT DETECTED
Opiates: NOT DETECTED
Tetrahydrocannabinol: NOT DETECTED

## 2022-02-02 LAB — DIFFERENTIAL
Abs Immature Granulocytes: 0.18 10*3/uL — ABNORMAL HIGH (ref 0.00–0.07)
Basophils Absolute: 0.1 10*3/uL (ref 0.0–0.1)
Basophils Relative: 0 %
Eosinophils Absolute: 0 10*3/uL (ref 0.0–0.5)
Eosinophils Relative: 0 %
Immature Granulocytes: 1 %
Lymphocytes Relative: 7 %
Lymphs Abs: 1.1 10*3/uL (ref 0.7–4.0)
Monocytes Absolute: 0.7 10*3/uL (ref 0.1–1.0)
Monocytes Relative: 5 %
Neutro Abs: 12.9 10*3/uL — ABNORMAL HIGH (ref 1.7–7.7)
Neutrophils Relative %: 87 %

## 2022-02-02 LAB — APTT: aPTT: 28 seconds (ref 24–36)

## 2022-02-02 LAB — CBC
HCT: 43.7 % (ref 36.0–46.0)
Hemoglobin: 15.7 g/dL — ABNORMAL HIGH (ref 12.0–15.0)
MCH: 31 pg (ref 26.0–34.0)
MCHC: 35.9 g/dL (ref 30.0–36.0)
MCV: 86.4 fL (ref 80.0–100.0)
Platelets: 260 10*3/uL (ref 150–400)
RBC: 5.06 MIL/uL (ref 3.87–5.11)
RDW: 11.9 % (ref 11.5–15.5)
WBC: 14.9 10*3/uL — ABNORMAL HIGH (ref 4.0–10.5)
nRBC: 0 % (ref 0.0–0.2)

## 2022-02-02 LAB — PROTIME-INR
INR: 1.1 (ref 0.8–1.2)
Prothrombin Time: 13.9 seconds (ref 11.4–15.2)

## 2022-02-02 LAB — OSMOLALITY, URINE: Osmolality, Ur: 258 mOsm/kg — ABNORMAL LOW (ref 300–900)

## 2022-02-02 LAB — CBG MONITORING, ED: Glucose-Capillary: 274 mg/dL — ABNORMAL HIGH (ref 70–99)

## 2022-02-02 LAB — I-STAT CHEM 8, ED
BUN: 5 mg/dL — ABNORMAL LOW (ref 8–23)
Calcium, Ion: 0.99 mmol/L — ABNORMAL LOW (ref 1.15–1.40)
Chloride: 85 mmol/L — ABNORMAL LOW (ref 98–111)
Creatinine, Ser: 0.6 mg/dL (ref 0.44–1.00)
Glucose, Bld: 262 mg/dL — ABNORMAL HIGH (ref 70–99)
HCT: 49 % — ABNORMAL HIGH (ref 36.0–46.0)
Hemoglobin: 16.7 g/dL — ABNORMAL HIGH (ref 12.0–15.0)
Potassium: 3.3 mmol/L — ABNORMAL LOW (ref 3.5–5.1)
Sodium: 121 mmol/L — ABNORMAL LOW (ref 135–145)
TCO2: 25 mmol/L (ref 22–32)

## 2022-02-02 LAB — RESP PANEL BY RT-PCR (FLU A&B, COVID) ARPGX2
Influenza A by PCR: NEGATIVE
Influenza B by PCR: NEGATIVE
SARS Coronavirus 2 by RT PCR: NEGATIVE

## 2022-02-02 LAB — ETHANOL: Alcohol, Ethyl (B): <10 mg/dL (ref <10)

## 2022-02-02 LAB — SODIUM, URINE, RANDOM: Sodium, Ur: 88 mmol/L

## 2022-02-02 MED ORDER — ASPIRIN 81 MG PO TBEC
81.0000 mg | DELAYED_RELEASE_TABLET | Freq: Every day | ORAL | Status: DC
  Administered 2022-02-03 – 2022-02-04 (×2): 81 mg via ORAL
  Filled 2022-02-02 (×2): qty 1

## 2022-02-02 MED ORDER — SIMVASTATIN 20 MG PO TABS
20.0000 mg | ORAL_TABLET | Freq: Every day | ORAL | Status: DC
Start: 2022-02-02 — End: 2022-02-04
  Administered 2022-02-03 (×2): 20 mg via ORAL
  Filled 2022-02-02 (×2): qty 1

## 2022-02-02 MED ORDER — DOCUSATE SODIUM 100 MG PO CAPS
100.0000 mg | ORAL_CAPSULE | Freq: Two times a day (BID) | ORAL | Status: DC
Start: 2022-02-02 — End: 2022-02-02

## 2022-02-02 MED ORDER — AMLODIPINE BESYLATE 5 MG PO TABS
5.0000 mg | ORAL_TABLET | Freq: Every day | ORAL | Status: DC
  Administered 2022-02-03 – 2022-02-04 (×2): 5 mg via ORAL
  Filled 2022-02-02 (×2): qty 1

## 2022-02-02 MED ORDER — DOCUSATE SODIUM 100 MG PO CAPS
100.0000 mg | ORAL_CAPSULE | Freq: Two times a day (BID) | ORAL | Status: DC
  Administered 2022-02-03 – 2022-02-04 (×3): 100 mg via ORAL
  Filled 2022-02-02 (×3): qty 1

## 2022-02-02 MED ORDER — SODIUM CHLORIDE 0.9 % IV BOLUS
500.0000 mL | Freq: Once | INTRAVENOUS | Status: AC
  Administered 2022-02-02: 500 mL via INTRAVENOUS

## 2022-02-02 MED ORDER — LEVOTHYROXINE SODIUM 75 MCG PO TABS
75.0000 ug | ORAL_TABLET | Freq: Every day | ORAL | Status: DC
  Administered 2022-02-03 – 2022-02-04 (×2): 75 ug via ORAL
  Filled 2022-02-02 (×2): qty 1

## 2022-02-02 MED ORDER — ENOXAPARIN SODIUM 40 MG/0.4ML IJ SOSY
40.0000 mg | PREFILLED_SYRINGE | INTRAMUSCULAR | Status: DC
  Administered 2022-02-03 – 2022-02-04 (×2): 40 mg via SUBCUTANEOUS
  Filled 2022-02-02 (×2): qty 0.4

## 2022-02-02 MED ORDER — METOPROLOL SUCCINATE ER 25 MG PO TB24
25.0000 mg | ORAL_TABLET | Freq: Every day | ORAL | Status: DC
  Administered 2022-02-03 – 2022-02-04 (×2): 25 mg via ORAL
  Filled 2022-02-02 (×3): qty 1

## 2022-02-02 MED ORDER — ONDANSETRON 4 MG PO TBDP
4.0000 mg | ORAL_TABLET | Freq: Once | ORAL | Status: AC
Start: 2022-02-02 — End: 2022-02-02
  Administered 2022-02-02: 4 mg via ORAL
  Filled 2022-02-02: qty 1

## 2022-02-02 MED ORDER — POTASSIUM CHLORIDE 20 MEQ PO PACK
40.0000 meq | PACK | Freq: Once | ORAL | Status: AC
  Administered 2022-02-03: 40 meq via ORAL
  Filled 2022-02-02: qty 2

## 2022-02-02 MED ORDER — BENAZEPRIL HCL 20 MG PO TABS
20.0000 mg | ORAL_TABLET | Freq: Every day | ORAL | Status: DC
  Administered 2022-02-03 – 2022-02-04 (×3): 20 mg via ORAL
  Filled 2022-02-02 (×4): qty 1

## 2022-02-02 MED ORDER — ENOXAPARIN SODIUM 40 MG/0.4ML IJ SOSY: 40.0000 mg | PREFILLED_SYRINGE | INTRAMUSCULAR | Status: DC

## 2022-02-02 MED ORDER — POTASSIUM CHLORIDE CRYS ER 20 MEQ PO TBCR
40.0000 meq | EXTENDED_RELEASE_TABLET | Freq: Once | ORAL | Status: DC
  Filled 2022-02-02: qty 2

## 2022-02-02 MED ORDER — AMLODIPINE BESYLATE 5 MG PO TABS: 5.0000 mg | ORAL_TABLET | Freq: Every day | ORAL | Status: DC

## 2022-02-02 MED ORDER — ASPIRIN 81 MG PO TBEC: 81.0000 mg | DELAYED_RELEASE_TABLET | Freq: Every day | ORAL | Status: DC

## 2022-02-02 MED ORDER — SIMVASTATIN 20 MG PO TABS: 20.0000 mg | ORAL_TABLET | Freq: Every day | ORAL | Status: DC

## 2022-02-02 MED ORDER — METOPROLOL SUCCINATE ER 25 MG PO TB24: 25.0000 mg | ORAL_TABLET | Freq: Every day | ORAL | Status: DC

## 2022-02-02 MED ORDER — BENAZEPRIL HCL 20 MG PO TABS: 20.0000 mg | ORAL_TABLET | Freq: Every day | ORAL | Status: DC

## 2022-02-02 NOTE — Assessment & Plan Note (Deleted)
Glucose elevated on admission, now 130s. No history of diabetes. A1c 5.6. -Follow with BMPs -Consider SSI

## 2022-02-02 NOTE — ED Provider Notes (Signed)
[facility] EMERGENCY DEPARTMENT Provider Note   CSN: [id number] Arrival date & time: 02/02/22  1528     History  Chief Complaint  Patient presents with   Code Stroke    Amy Roth is a 88 y.o. female.  HPI 88 year old female presents with strokelike symptoms.  Was brought in by EMS but they are no longer present when I am seeing the patient.  According to the nurses spoke to them, patient was last known well last night when seen by [employer].  Currently having left facial droop and left-sided weakness.  Patient is confused which is also a new symptom according to EMS.  Patient knows she is in the hospital but is otherwise disoriented and unable to provide any significant history.  She denies any pain or headache.  Home Medications Prior to Admission medications   Medication Sig Start Date End Date Taking? Authorizing Provider  amLODipine (NORVASC) 5 MG tablet Take 5 mg by mouth daily.    [provider]  aspirin EC 81 MG tablet Take 81 mg by mouth daily. Swallow whole.    [provider]  benazepril (LOTENSIN) 20 MG tablet Take 20 mg by mouth daily.    [provider]  docusate sodium (COLACE) 100 MG capsule Take 100 mg by mouth 2 (two) times daily.    [provider]  estradiol (ESTRACE) 0.1 MG/GM vaginal cream Place 1 g vaginally once a week. 01/01/22   [provider]  levothyroxine (SYNTHROID) 75 MCG tablet Take 75 mcg by mouth daily before breakfast.    [provider]  meloxicam (MOBIC) 15 MG tablet Take 15 mg by mouth daily.    [provider]  metoprolol succinate (TOPROL-XL) 25 MG 24 hr tablet Take 25 mg by mouth daily.    [provider]  PARoxetine (PAXIL) 20 MG tablet Take 20 mg by mouth at bedtime.    [provider]  simvastatin (ZOCOR) 20 MG tablet Take 20 mg by mouth daily.    [provider]  sulfamethoxazole-trimethoprim (BACTRIM DS) 800-160  MG tablet Take 1 tablet by mouth 2 (two) times daily.    [provider]  Vitamin D, Ergocalciferol, (DRISDOL) 1.25 MG (50000 UNIT) CAPS capsule Take 50,000 Units by mouth every 7 (seven) days.    [provider]      Allergies    Penicillins    Review of Systems   Review of Systems  Unable to perform ROS: Mental status change    Physical Exam Updated Vital Signs BP (!) 155/73 (BP Location: Left Arm)   Pulse 65   Temp (!) 97.5 F (36.4 C) (Oral)   Resp 18   Ht 5\' 4"  (1.626 m)   Wt 72.6 kg   SpO2 93%   BMI 27.46 kg/m  Physical Exam Vitals and nursing note reviewed.  Constitutional:      Appearance: She is well-developed. She is not diaphoretic.  HENT:     Head: Normocephalic and atraumatic.  Eyes:     Extraocular Movements: Extraocular movements intact.     Pupils: Pupils are equal, round, and reactive to light.  Cardiovascular:     Rate and Rhythm: Normal rate. Rhythm irregular.     Heart sounds: Normal heart sounds.  Pulmonary:     Effort: Pulmonary effort is normal.     Breath sounds: Normal breath sounds.  Abdominal:     General: There is no distension.     Palpations:  Abdomen is soft.     Tenderness: There is no abdominal tenderness.  Skin:    General: Skin is warm and dry.  Neurological:     Mental Status: She is alert. She is disoriented.     Comments: Awake, alert, oriented to person, place. Disoriented to time and situation. Left sided facial droop. 5/5 strength in RUE, RLE, LLE. Mild weakness in LUE. Decreased sensation to LUE. No drift. No visual field deficit.     ED Results / Procedures / Treatments   Labs (all labs ordered are listed, but only abnormal results are displayed) Labs Reviewed  CBC - Abnormal; Notable for the following components:      Result Value   WBC 14.9 (*)    Hemoglobin 15.7 (*)    All other components within normal limits  DIFFERENTIAL - Abnormal; Notable for the following components:   Neutro Abs 12.9 (*)     Abs Immature Granulocytes 0.18 (*)    All other components within normal limits  COMPREHENSIVE METABOLIC PANEL - Abnormal; Notable for the following components:   Sodium 123 (*)    Potassium 3.2 (*)    Chloride 86 (*)    CO2 20 (*)    Glucose, Bld 248 (*)    ALT 52 (*)    Total Bilirubin 1.3 (*)    Anion gap 17 (*)    All other components within normal limits  URINALYSIS, ROUTINE W REFLEX MICROSCOPIC - Abnormal; Notable for the following components:   Color, Urine STRAW (*)    Glucose, UA 150 (*)    Ketones, ur 20 (*)    All other components within normal limits  OSMOLALITY, URINE - Abnormal; Notable for the following components:   Osmolality, Ur 258 (*)    All other components within normal limits  I-STAT CHEM 8, ED - Abnormal; Notable for the following components:   Sodium 121 (*)    Potassium 3.3 (*)    Chloride 85 (*)    BUN 5 (*)    Glucose, Bld 262 (*)    Calcium, Ion 0.99 (*)    Hemoglobin 16.7 (*)    HCT 49.0 (*)    All other components within normal limits  CBG MONITORING, ED - Abnormal; Notable for the following components:   Glucose-Capillary 274 (*)    All other components within normal limits  RESP PANEL BY RT-PCR (FLU A&B, COVID) ARPGX2  URINE CULTURE  ETHANOL  PROTIME-INR  APTT  RAPID URINE DRUG SCREEN, HOSP PERFORMED  SODIUM, URINE, RANDOM  OSMOLALITY  TSH  BASIC METABOLIC PANEL  BASIC METABOLIC PANEL  BASIC METABOLIC PANEL  BASIC METABOLIC PANEL  CBC  CREATININE, URINE, RANDOM  CORTISOL-AM, BLOOD  HEMOGLOBIN A1C    EKG EKG Interpretation  Date/Time:  Thursday February 02 2022 15:34:15 EDT Ventricular Rate:  90 PR Interval:  181 QRS Duration: 111 QT Interval:  395 QTC Calculation: 484 R Axis:   103 Text Interpretation: Sinus rhythm Consider left atrial enlargement Right axis deviation No old tracing to compare Confirmed by Pricilla Loveless [id number]) on 02/02/2022 3:41:48 PM  Radiology CT HEAD WO CONTRAST  Result Date: 02/02/2022 CLINICAL  DATA:  Neurological deficit EXAM: CT HEAD WITHOUT CONTRAST TECHNIQUE: Contiguous axial images were obtained from the base of the skull through the vertex without intravenous contrast. RADIATION DOSE REDUCTION: This exam was performed according to the departmental dose-optimization program which includes automated exposure control, adjustment of the mA and/or kV according to patient size and/or use of iterative  reconstruction technique. COMPARISON:  Head CT dated October 24, 2020 FINDINGS: Brain: Mild chronic white matter ischemic change. No evidence of acute infarction, hemorrhage, hydrocephalus, extra-axial collection or mass lesion/mass effect. Vascular: No hyperdense vessel or unexpected calcification. Skull: Normal. Negative for fracture or focal lesion. Sinuses/Orbits: No acute finding. Other: None. IMPRESSION: No acute intracranial abnormality. Electronically Signed   By: Allegra Lai M.D.   On: 02/02/2022 16:16    Procedures .Critical Care  Performed by: Pricilla Loveless, MD Authorized by: Pricilla Loveless, MD   Critical care provider statement:    Critical care time (minutes):  30   Critical care time was exclusive of:  Separately billable procedures and treating other patients   Critical care was necessary to treat or prevent imminent or life-threatening deterioration of the following conditions:  Metabolic crisis   Critical care was time spent personally by me on the following activities:  Development of treatment plan with patient or surrogate, discussions with consultants, evaluation of patient's response to treatment, examination of patient, ordering and review of laboratory studies, ordering and review of radiographic studies, ordering and performing treatments and interventions, pulse oximetry, re-evaluation of patient's condition and review of old charts     Medications Ordered in ED Medications  levothyroxine (SYNTHROID) tablet 75 mcg (has no administration in time range)   amLODipine (NORVASC) tablet 5 mg (has no administration in time range)  aspirin EC tablet 81 mg (has no administration in time range)  benazepril (LOTENSIN) tablet 20 mg (has no administration in time range)  docusate sodium (COLACE) capsule 100 mg (has no administration in time range)  enoxaparin (LOVENOX) injection 40 mg (has no administration in time range)  metoprolol succinate (TOPROL-XL) 24 hr tablet 25 mg (has no administration in time range)  simvastatin (ZOCOR) tablet 20 mg (has no administration in time range)  potassium chloride (KLOR-CON) packet 40 mEq (has no administration in time range)  sodium chloride 0.9 % bolus 500 mL (500 mLs Intravenous New Bag/Given 02/02/22 2200)  ondansetron (ZOFRAN-ODT) disintegrating tablet 4 mg (4 mg Oral Given 02/02/22 2157)    ED Course/ Medical Decision Making/ A&P Clinical Course as of 02/02/22 2329  Thu Feb 02, 2022  1539 At this time, according to the nurses spoke to EMS, last known well is sometime last night.  Thus she is out of code stroke window as there is no obvious LVO symptoms.  We will proceed with stroke work-up. [SG]    Clinical Course User Index [SG] Pricilla Loveless, MD                           Medical Decision Making Amount and/or Complexity of Data Reviewed Labs: ordered.    Details: Sodium 123.  Nonspecific leukocytosis.  No UTI Radiology: ordered and independent interpretation performed.    Details: CT head without head bleed ECG/medicine tests: independent interpretation performed.    Details: no acute ischemia  Risk Decision regarding hospitalization.   I was able to talk to the daughter after the initial work-up but she has arrived to the emergency department.  She lives with patient and patient has chronic confusion but is definitely altered today.  Hard to tell last known well.  Either way my suspicion for stroke is actually fairly low as I think her altered mental state is related to hyponatremia.  Last him I can  see was from last year and was normal.  At this point, she might have some dehydration but  not overtly so.  I discussed with the family practice service who will admit.        Final Clinical Impression(s) / ED Diagnoses Final diagnoses:  Hyponatremia    Rx / DC Orders ED Discharge Orders     None         Pricilla Loveless, MD 02/02/22 2332

## 2022-02-02 NOTE — ED Triage Notes (Signed)
EDP at bedside  

## 2022-02-02 NOTE — Plan of Care (Signed)

## 2022-02-02 NOTE — Hospital Course (Addendum)
Amy Roth is an 88yo F admitted to the inpatient Family Medicine Teaching Service from 8/3 - 8/5 due to altered mental status with hyponatremia. Her hospital course is outlined below.  Hyponatremia Sodium 121 on admission, improved with fluid boluses x2 and sodium improved to 128 by discharge. Urine osm 263, AM cortisol 26.2. Thought to be primarily related to hypovolemia given recently decreased fluid intake. Presumed to be chronic but no recent labs to compare. Her paroxetine was held during her stay due to initial concern for SIADH but this was continued after sodium improvement with oral intake. At discharge patient was tolerating PO, mental status slightly improved. Son at the bedside declined repeat afternoon BMP prior to d/c. Family will help to arrange outpatient follow up with PCP for repeat lab work.   Altered mental state Patient was oriented to person and place but not time. No focal neuro deficits and CT head negative for acute intracranial process. Thought to be progression of chronic underlying cognitive impairment vs dementia vs hyponatremia. Pt with difficulty with short term memory and has been slowly declining in function over the last several months. Suspect also underlying component of demential.   Chronic Stable Conditions: Hypothyroidism - Continued Levothyroxine daily Hypertension - Continued Amlodipine, Benazepril, Metoprolol, Aspirin Hyperlipidemia - Continued Simvastatin   Follow up recommendations: Consider evaluation for dementia; pt with reported functional decline over several months with short term memory issues. Recheck BMP to monitor continued improvement in hyponatremia. Follow-Up Urine Culture

## 2022-02-02 NOTE — ED Triage Notes (Signed)
Pt arrived via [facility] from home. Pt with stroke like symptoms affecting the L side and acute confusion. Pt was alone and the best LKW time was last PM.   EMS V/S:  198/110 HR 80 SpO2 94% on 2L CBG 197

## 2022-02-02 NOTE — ED Notes (Signed)
Per EDP we are not calling a code CVA at this time without clear LKW.

## 2022-02-02 NOTE — ED Notes (Signed)
Attempted to admin K tablets and pt vomited after taking 1st tablet which was noted in emesis.

## 2022-02-02 NOTE — H&P (Addendum)
Hospital Admission History and Physical Service Pager: [phone]  Patient name: Amy Roth Medical record number: [id number] Date of Birth: [birth year] Age: 88 y.o. Gender: female  Primary Care Provider: Margaretann Loveless, MD Consultants: none Code Status: DNR Preferred Emergency Contact:  Contact Information     Name Relation Home Work [place] Daughter   [phone]        Chief Complaint: confusion  Assessment and Plan: Amy Roth is a 88 y.o. female presenting with altered mental status. Differential for this patient's presentation of this includes chronic hyponatremia, stroke, TIA, and dementia.  Chronic hyponatremia due to dehydration is most likely diagnosis with history of decreased PO intake, low calculated serum osmolality, and suspected hypovolemia on exam. The urine sodium and osm values indicate possible alternative cause of hyponatremia: SIADH/hypothyroidism/glucocorticoid deficiency. It is unclear if these values are accurate in setting of ACE inhibitor use. Patient has history of hypothyroidism and recently lowered Levothyroxine dose, and takes paroxetine a possible cause of SIADH. While unlikely, these diagnoses cannot be ruled out as causes of hyponatremia and presenting AMS.   Stroke and TIA are less likely diagnoses with negative head CT and patient's left side facial droop is chronic due to previous surgery. Dementia cannot be ruled out as patient has had a reported decline in functional status over the past year and prior memory issues.  * Hyponatremia Presumed chronic with no recent labs to compare, symptomatic. Vitals stable. Sodium of 121 on admission with x1 episode of emesis and confusion. Most likely hypovolemic hyponatremia. Will see how patient responds to small NS boluses with serial BMP. Based on relatively normal urine osm >100, could also represent SIADH, hypothyroidism, or adrenal insufficiency. -Admit to [facility]  Teaching Service, Med-Tele, attending Dr. Leveda Anna -x1 NS bolus, reevaluate after next BMP before another -Maximum increase in sodium in 24hrs -BMP q4h -Follow-up measured Serum Osm, Urine Cr -Held Paroxetine -TSH, Cortisol AM -Strict I/O -Fluid restriction -Vitals per floor -Cardiac monitoring  Altered mental state Acute, stable. Patient is oriented to person and place but not time which is different from her baseline. She has no focal deficits. CT head negative for acute intracranial process. Likely due to chronic hyponatremia. Underlying mild cognitive impairment or dementia is likely also playing a role. -Consider MRI if treatment of hyponatremia does not improve mental status -Delirium precautions -PT/OT -Follow-up urine culture  Hyperglycemia Glucose elevated to 250s on admission. No history of diabetes.  -Follow with BMPs -A1c -Consider SSI   Chronic Stable Conditions: Hypothyroidism - Continue Levothyroxine daily Hypertension - Continue Amlodipine, Benazepril, Metoprolol, Aspirin Hyperlipidemia - Continue Simvastatin    FEN/GI: Regular diet VTE Prophylaxis: Lovenox  Disposition: med-tele  History of Present Illness:  Amy Roth is a 88 y.o. female presenting with altered mental status.  Daughter states that patient's son came over this morning to take her to a hair appointment when she was found to be confused. Fire department came over and felt she may have been dehydrated or have a UTI. Daughter reports she has been drinking less than usual for the past few days. Daughter notes that she has been sleeping a lot and more tired than usual. Reports she has had urinary frequency.   She had a UTI 1 month ago on July 4th for which she was treated with a 5-day course of antibiotics.  Per daughter, mental status has been slowly declining but she is usually well oriented. She  does have some minor issues with short-term memory which daughter  feels is age-appropriate. She manages her own medications. Daughter reports she has had chronic left-sided facial droop for over 10 years related to an eye surgery.  Her PCP recently increased her paroxetine from 20 to 30 mg but apparently did not do well with this, so was reduced back to 20 mg.  Daughter reports she eats "a lot" of  bananas. Patient reports she has been drinking less, but eating okay.  ED Course In the ED, patient was hypertensive, SpO2 92% on RA, and RR of 21. Labs were significant for Na 121, K+ 3.3, WBC 14.9, Anion Gap of 17, bicarb of 20. CT head was negative for acute intracranial abnormality.  Review Of Systems: Per HPI with the following additions:  Review of Systems  Constitutional:  Positive for malaise/fatigue. Negative for chills and fever.  Gastrointestinal:  Positive for constipation. Negative for abdominal pain and nausea.  Genitourinary:  Positive for frequency.  Neurological:        Left facial droop at baseline     Pertinent Past Medical History: Hypertension Hypothyroidism Anxiety CKD 3 Remainder reviewed in history tab.   Pertinent Past Surgical History: Eye surgery, left  Remainder reviewed in history tab.  Pertinent Social History: Tobacco use: Yes/No/Former Alcohol use: no Other Substance use: no Lives with Tammy (daughter)  Pertinent Family History: Heart disease on father's side Remainder reviewed in history tab.   Important Outpatient Medications: Amlodipine 5mg  daily Aspirin 81 mg daily Benazepril 20mg  daily Colace 100mg  twice daily Toprol XL 25mg  daily Paxil 20mg  daily Remainder reviewed in medication history.   Objective: BP (!) 143/84   Pulse 67   Temp 97.7 F (36.5 C) (Oral)   Resp 20   Ht 5\' 4"  (1.626 m)   Wt 72.6 kg   SpO2 92%   BMI 27.46 kg/m  Exam: General: A&Ox2, NAD HEENT: No sign of trauma, EOM grossly intact Cardiac: RRR, no m/r/g Respiratory: CTAB, normal WOB, no w/c/r GI: Soft, NTTP,  non-distended  Extremities: NTTP, no peripheral edema. Psych: Appropriate mood and affect  Neuro: Memory: Intact . PEERLA. Cranial nerves: II through XII are intact. Strength 5/5 in upper and lower ext's bilaterally. Left sided facial droop   Labs:  CBC BMET  Recent Labs  Lab 02/02/22 1639 02/02/22 1710  WBC 14.9*  --   HGB 15.7* 16.7*  HCT 43.7 49.0*  PLT 260  --    Recent Labs  Lab 02/02/22 1639 02/02/22 1710  NA 123* 121*  K 3.2* 3.3*  CL 86* 85*  CO2 20*  --   BUN 8 5*  CREATININE 0.88 0.60  GLUCOSE 248* 262*  CALCIUM 8.9  --     Pertinent additional labs: Ethanol <10, UA negative, UDS negative, CBG 274. Urine Osm 258, Urine Na 88  EKG: My own interpretation (not copied from electronic read): no acute ST changes, no prior to compare, sinus rhythm, enlarged P wave   Imaging Studies Performed:  CT HEAD WO CONTRAST 02/02/22 IMPRESSION: No acute intracranial abnormality.   Celine Mans, MD 02/02/2022, 10:32 PM PGY-1, [facility]  FPTS Intern pager: [phone], text pages welcome Secure chat group [facility] Teaching Service   Hypo-osmolar hyponatremia - possibly with mild hypovolemia Suspect secondary to dehydration vs SIADH  I was personally present and performed or re-performed the history, physical exam and medical decision making activities of this service and have verified that the service and findings  are accurately documented in the resident's note.  Littie Deeds, MD                  02/02/2022, 10:37 PM

## 2022-02-02 NOTE — Assessment & Plan Note (Deleted)
-  500 NS ml bolus -Serum Osm, Urine Na, Urine Osm, Urine Cr -Held Paroxetine -Consider fluids

## 2022-02-02 NOTE — Assessment & Plan Note (Addendum)
Presumed chronic in the setting of dehydration and decreased oral intake.  Sodium improved with small boluses. Cortisol, TSH nonconcerning. -Restart paroxetine -Encourage good oral intake at discharge -PM BMP before considering discharge

## 2022-02-02 NOTE — Assessment & Plan Note (Addendum)
Stable, patient still and oriented to time despite improvement in hyponatremia. Now suspect that this is unlikely acute change in mental status in the setting of possible underlying dementia. -Consider MRI before discharge -Delirium precautions -Home health ordered

## 2022-02-03 DIAGNOSIS — R4182 Altered mental status, unspecified: Secondary | ICD-10-CM | POA: Diagnosis present

## 2022-02-03 DIAGNOSIS — E860 Dehydration: Secondary | ICD-10-CM

## 2022-02-03 DIAGNOSIS — R410 Disorientation, unspecified: Secondary | ICD-10-CM

## 2022-02-03 DIAGNOSIS — Z66 Do not resuscitate: Secondary | ICD-10-CM | POA: Diagnosis present

## 2022-02-03 DIAGNOSIS — R29810 Facial weakness: Secondary | ICD-10-CM | POA: Diagnosis present

## 2022-02-03 DIAGNOSIS — Z87440 Personal history of urinary (tract) infections: Secondary | ICD-10-CM | POA: Diagnosis not present

## 2022-02-03 DIAGNOSIS — F0390 Unspecified dementia without behavioral disturbance: Secondary | ICD-10-CM | POA: Diagnosis present

## 2022-02-03 DIAGNOSIS — Z7982 Long term (current) use of aspirin: Secondary | ICD-10-CM | POA: Diagnosis not present

## 2022-02-03 DIAGNOSIS — Z79890 Hormone replacement therapy (postmenopausal): Secondary | ICD-10-CM | POA: Diagnosis not present

## 2022-02-03 DIAGNOSIS — I10 Essential (primary) hypertension: Secondary | ICD-10-CM | POA: Diagnosis present

## 2022-02-03 DIAGNOSIS — E871 Hypo-osmolality and hyponatremia: Secondary | ICD-10-CM | POA: Diagnosis present

## 2022-02-03 DIAGNOSIS — Z79899 Other long term (current) drug therapy: Secondary | ICD-10-CM | POA: Diagnosis not present

## 2022-02-03 DIAGNOSIS — Z791 Long term (current) use of non-steroidal anti-inflammatories (NSAID): Secondary | ICD-10-CM | POA: Diagnosis not present

## 2022-02-03 DIAGNOSIS — R739 Hyperglycemia, unspecified: Secondary | ICD-10-CM | POA: Diagnosis present

## 2022-02-03 DIAGNOSIS — F419 Anxiety disorder, unspecified: Secondary | ICD-10-CM | POA: Diagnosis present

## 2022-02-03 DIAGNOSIS — Z20822 Contact with and (suspected) exposure to covid-19: Secondary | ICD-10-CM | POA: Diagnosis present

## 2022-02-03 DIAGNOSIS — E039 Hypothyroidism, unspecified: Secondary | ICD-10-CM | POA: Diagnosis present

## 2022-02-03 DIAGNOSIS — E785 Hyperlipidemia, unspecified: Secondary | ICD-10-CM | POA: Diagnosis present

## 2022-02-03 LAB — CBC
HCT: 44.4 % (ref 36.0–46.0)
Hemoglobin: 16.6 g/dL — ABNORMAL HIGH (ref 12.0–15.0)
MCH: 31.2 pg (ref 26.0–34.0)
MCHC: 37.4 g/dL — ABNORMAL HIGH (ref 30.0–36.0)
MCV: 83.5 fL (ref 80.0–100.0)
Platelets: 274 10*3/uL (ref 150–400)
RBC: 5.32 MIL/uL — ABNORMAL HIGH (ref 3.87–5.11)
RDW: 11.9 % (ref 11.5–15.5)
WBC: 14.1 10*3/uL — ABNORMAL HIGH (ref 4.0–10.5)
nRBC: 0 % (ref 0.0–0.2)

## 2022-02-03 LAB — BASIC METABOLIC PANEL
Anion gap: 14 (ref 5–15)
Anion gap: 13 (ref 5–15)
Anion gap: 12 (ref 5–15)
Anion gap: 10 (ref 5–15)
BUN: 7 mg/dL — ABNORMAL LOW (ref 8–23)
BUN: 5 mg/dL — ABNORMAL LOW (ref 8–23)
BUN: 6 mg/dL — ABNORMAL LOW (ref 8–23)
BUN: 6 mg/dL — ABNORMAL LOW (ref 8–23)
CO2: 20 mmol/L — ABNORMAL LOW (ref 22–32)
CO2: 23 mmol/L (ref 22–32)
CO2: 21 mmol/L — ABNORMAL LOW (ref 22–32)
CO2: 21 mmol/L — ABNORMAL LOW (ref 22–32)
Calcium: 9 mg/dL (ref 8.9–10.3)
Calcium: 9.4 mg/dL (ref 8.9–10.3)
Calcium: 9.2 mg/dL (ref 8.9–10.3)
Calcium: 9 mg/dL (ref 8.9–10.3)
Chloride: 90 mmol/L — ABNORMAL LOW (ref 98–111)
Chloride: 91 mmol/L — ABNORMAL LOW (ref 98–111)
Chloride: 94 mmol/L — ABNORMAL LOW (ref 98–111)
Chloride: 96 mmol/L — ABNORMAL LOW (ref 98–111)
Creatinine, Ser: 0.69 mg/dL (ref 0.44–1.00)
Creatinine, Ser: 0.7 mg/dL (ref 0.44–1.00)
Creatinine, Ser: 0.68 mg/dL (ref 0.44–1.00)
Creatinine, Ser: 0.93 mg/dL (ref 0.44–1.00)
GFR, Estimated: >60 mL/min (ref >60)
GFR, Estimated: >60 mL/min (ref >60)
GFR, Estimated: >60 mL/min (ref >60)
GFR, Estimated: 60 mL/min — ABNORMAL LOW (ref >60)
Glucose, Bld: 137 mg/dL — ABNORMAL HIGH (ref 70–99)
Glucose, Bld: 102 mg/dL — ABNORMAL HIGH (ref 70–99)
Glucose, Bld: 111 mg/dL — ABNORMAL HIGH (ref 70–99)
Glucose, Bld: 138 mg/dL — ABNORMAL HIGH (ref 70–99)
Potassium: 4.1 mmol/L (ref 3.5–5.1)
Potassium: 4 mmol/L (ref 3.5–5.1)
Potassium: 4.2 mmol/L (ref 3.5–5.1)
Potassium: 4.3 mmol/L (ref 3.5–5.1)
Sodium: 124 mmol/L — ABNORMAL LOW (ref 135–145)
Sodium: 127 mmol/L — ABNORMAL LOW (ref 135–145)
Sodium: 127 mmol/L — ABNORMAL LOW (ref 135–145)
Sodium: 127 mmol/L — ABNORMAL LOW (ref 135–145)

## 2022-02-03 LAB — HEMOGLOBIN A1C
Hgb A1c MFr Bld: 5.6 % (ref 4.8–5.6)
Mean Plasma Glucose: 114.02 mg/dL

## 2022-02-03 LAB — OSMOLALITY: Osmolality: 263 mOsm/kg — ABNORMAL LOW (ref 275–295)

## 2022-02-03 LAB — CORTISOL-AM, BLOOD: Cortisol - AM: 26.2 ug/dL — ABNORMAL HIGH (ref 6.7–22.6)

## 2022-02-03 LAB — TSH: TSH: 0.94 u[IU]/mL (ref 0.350–4.500)

## 2022-02-03 MED ORDER — SODIUM CHLORIDE 0.9 % IV BOLUS
500.0000 mL | Freq: Once | INTRAVENOUS | Status: AC
  Administered 2022-02-03: 500 mL via INTRAVENOUS

## 2022-02-03 MED ORDER — POLYVINYL ALCOHOL 1.4 % OP SOLN
1.0000 [drp] | OPHTHALMIC | Status: DC | PRN
  Filled 2022-02-03: qty 15

## 2022-02-03 NOTE — Evaluation (Signed)
Physical Therapy Evaluation Patient Details Name: Amy Roth MRN: [id number] DOB: 09-Mar-1934 Today's Date: 02/03/2022  History of Present Illness  Pt is an 88 y.o. female admitted 02/02/22 with AMS. Head CT negative for acute injury. Workup for chronic hyonatremia; suspect underlying mild cognitive impairment or dementia. PMH includes HTN, hypothyroidism, chronic L-side facial droop from prior sx.   Clinical Impression  Pt presents with an overall decrease in functional mobility secondary to above. PTA, pt typically independent without DME, lives with children. Today, pt's stability improved with RW, able to ambulate and perform ADL with close min guard for balance, requiring frequent cues for safety and sequencing; at increased risk for falls. Pt's daughter present and supportive, reports family available to provide necessary assist/supervision. Pt would benefit from continued acute PT services to maximize functional mobility and independence prior to d/c with HHPT services.      Recommendations for follow up therapy are one component of a multi-disciplinary discharge planning process, led by the attending physician.  Recommendations may be updated based on patient status, additional functional criteria and insurance authorization.  Follow Up Recommendations Home health PT      Assistance Recommended at Discharge Frequent or constant Supervision/Assistance  Patient can return home with the following  A little help with walking and/or transfers;A little help with bathing/dressing/bathroom;Assistance with cooking/housework;Direct supervision/assist for medications management;Direct supervision/assist for financial management;Assist for transportation;Help with stairs or ramp for entrance    Equipment Recommendations Rolling walker (2 wheels)  Recommendations for Other Services       Functional Status Assessment Patient has had a recent decline in their functional status and  demonstrates the ability to make significant improvements in function in a reasonable and predictable amount of time.     Precautions / Restrictions Precautions Precautions: Fall Restrictions Weight Bearing Restrictions: No      Mobility  Bed Mobility Overal bed mobility: Needs Assistance Bed Mobility: Sit to Supine       Sit to supine: Min assist   General bed mobility comments: significant increased time and difficulty going from sitting EOB to laying flat/supine secondary to cognition, ultimately requiring cues for sequencing and minA to reposition trunk    Transfers Overall transfer level: Needs assistance Equipment used: Rolling walker (2 wheels), None Transfers: Sit to/from Stand Sit to Stand: Min guard           General transfer comment: able to stand from recliner to RW with min guard; additional stands from low toilet height and EOB without DME, close min guard for balance    Ambulation/Gait Ambulation/Gait assistance: Min guard Gait Distance (Feet): 80 Feet (+ 30') Assistive device: Rolling walker (2 wheels), None Gait Pattern/deviations: Step-through pattern, Decreased stride length, Trunk flexed Gait velocity: Decreased     General Gait Details: slow, mildly unsteady gait ambulating in hallway with RW and min guard for balance, pt self-limiting distance despite encouragement wanting to take a nap; pt leaving RW behind when going to enter bathroom, ambulatory throughout room without DME, close min guard; requiring verbal cues for safety and finding things in room (i.e. trashcan to throw away paper towel)  Stairs            Wheelchair Mobility    Modified Rankin (Stroke Patients Only)       Balance Overall balance assessment: Needs assistance   Sitting balance-Leahy Scale: Good     Standing balance support: No upper extremity supported, During functional activity Standing balance-Leahy Scale: Fair Standing balance comment: can stand,  ambulate, perform pericare and wash hands without UE support, min guard for balance; static and dynamic stability improved with RW                             Pertinent Vitals/Pain Pain Assessment Pain Assessment: No/denies pain    Home Living Family/patient expects to be discharged to:: Private residence Living Arrangements: Children Available Help at Discharge: Family;Available 24 hours/day Type of Home: House           Home Equipment: [employer] Mor - single point Additional Comments: lives with daughter and son    Prior Function Prior Level of Function : Independent/Modified Independent             Mobility Comments: pt reports independence with mobility, daughter reports family has been talking with her about using AD for mobility ADLs Comments: sponge bathes at baseline. mod I with basic ADLs     Hand Dominance        Extremity/Trunk Assessment   Upper Extremity Assessment Upper Extremity Assessment: Generalized weakness    Lower Extremity Assessment Lower Extremity Assessment: Generalized weakness    Cervical / Trunk Assessment Cervical / Trunk Assessment: Kyphotic  Communication   Communication: HOH  Cognition Arousal/Alertness: Awake/alert Behavior During Therapy: Flat affect Overall Cognitive Status: Impaired/Different from baseline Area of Impairment: Orientation, Attention, Memory, Following commands, Safety/judgement, Awareness, Problem solving                 Orientation Level: Disoriented to, Time, Situation Current Attention Level: Sustained Memory: Decreased short-term memory Following Commands: Follows one step commands inconsistently Safety/Judgement: Decreased awareness of safety, Decreased awareness of deficits Awareness: Intellectual Problem Solving: Slow processing, Decreased initiation, Difficulty sequencing, Requires verbal cues General Comments: oriented to self and [facility], reports Feburary 2003; daughter  reports pt fully oriented. pt having difficulty performing basic, automatic tasks, including finding TP and disposing of paper in toilet, using soap and paper towels when washing hands, fully drying hands and finding trashcan, requiring cues to initiate some and fully complete all of these tasks        General Comments General comments (skin integrity, edema, etc.): pt's daughter present and supportive, agrees on need for RW at home and would like one sent home with pt if possible. discussed pt's fall risk and current cognitive status with daughter, including need for 24/7 supervision for safety, which daughter reports they are able to provide    Exercises     Assessment/Plan    PT Assessment Patient needs continued PT services  PT Problem List Decreased strength;Decreased activity tolerance;Decreased balance;Decreased mobility;Decreased cognition;Decreased knowledge of use of DME;Decreased safety awareness       PT Treatment Interventions DME instruction;Gait training;Stair training;Functional mobility training;Therapeutic activities;Therapeutic exercise;Balance training;Patient/family education    PT Goals (Current goals can be found in the Care Plan section)  Acute Rehab PT Goals Patient Stated Goal: return home with family support PT Goal Formulation: With patient/family Time For Goal Achievement: 02/17/22 Potential to Achieve Goals: Good    Frequency Min 3X/week     Co-evaluation               AM-PAC PT "6 Clicks" Mobility  Outcome Measure Help needed turning from your back to your side while in a flat bed without using bedrails?: A Little Help needed moving from lying on your back to sitting on the side of a flat bed without using bedrails?: A Little Help needed moving to  and from a bed to a chair (including a wheelchair)?: A Little Help needed standing up from a chair using your arms (e.g., wheelchair or bedside chair)?: A Little Help needed to walk in hospital  room?: A Little Help needed climbing 3-5 steps with a railing? : A Little 6 Click Score: 18    End of Session Equipment Utilized During Treatment: Gait belt Activity Tolerance: Patient tolerated treatment well;Patient limited by fatigue Patient left: in bed;with call bell/phone within reach;with bed alarm set;with family/visitor present;Other (comment) (with lab team) Nurse Communication: Mobility status PT Visit Diagnosis: Other abnormalities of gait and mobility (R26.89);Muscle weakness (generalized) (M62.81)    Time: 6962-9528 PT Time Calculation (min) (ACUTE ONLY): 18 min   Charges:   PT Evaluation $PT Eval Moderate Complexity: 1 Mod        Ina Homes, PT, DPT Acute Rehabilitation Services  Personal: Secure Chat Rehab Office: [phone]  Malachy Chamber 02/03/2022, 1:44 PM

## 2022-02-03 NOTE — Evaluation (Signed)
Occupational Therapy Evaluation Patient Details Name: Amy Roth MRN: [id number] DOB: [birth year] Today's Date: 02/03/2022   History of Present Illness Pt is an 88 y.o. female admitted 02/02/22 with AMS. Head CT negative for acute injury. Workup for chronic hyonatremia; suspect underlying mild cognitive impairment or dementia. PMH includes HTN, hypothyroidism, chronic L-side facial droop from prior sx.   Clinical Impression   Pt admitted with the above diagnoses and presents with below problem list. Pt will benefit from continued acute OT to address the below listed deficits and maximize independence with basic ADLs prior to d/c home where she resides with family. At baseline, pt is mod I with ADLs. Daughter reports they have been trying to get pt to use AD for mobility. Pt currently needs min guard assist with LB ADLs utilizing walker (min A needed during BSC transfer without walker). Daughter present throughout session. Pt up in recliner at end of session eating lunch.        Recommendations for follow up therapy are one component of a multi-disciplinary discharge planning process, led by the attending physician.  Recommendations may be updated based on patient status, additional functional criteria and insurance authorization.   Follow Up Recommendations  Home health OT    Assistance Recommended at Discharge Frequent or constant Supervision/Assistance  Patient can return home with the following A little help with walking and/or transfers;A little help with bathing/dressing/bathroom;Assistance with cooking/housework;Direct supervision/assist for medications management;Direct supervision/assist for financial management;Assist for transportation    Functional Status Assessment  Patient has had a recent decline in their functional status and demonstrates the ability to make significant improvements in function in a reasonable and predictable amount of time.  Equipment Recommendations   None recommended by OT    Recommendations for Other Services       Precautions / Restrictions Precautions Precautions: Fall Restrictions Weight Bearing Restrictions: No      Mobility Bed Mobility Overal bed mobility: Needs Assistance Bed Mobility: Supine to Sit     Supine to sit: Min assist     General bed mobility comments: reaching for therapist arm for assist with trunk elevation. Cues and extra time/effort to come to full EOB position    Transfers Overall transfer level: Needs assistance Equipment used: Rolling walker (2 wheels), None Transfers: Sit to/from Stand Sit to Stand: Min assist, Min guard           General transfer comment: min A to steady during transfer to [facility] without AD. Min guard with walker use. to/from EOB, BSC, and recliner      Balance Overall balance assessment: Needs assistance   Sitting balance-Leahy Scale: Good     Standing balance support: No upper extremity supported, During functional activity Standing balance-Leahy Scale: Fair Standing balance comment: fair static, poor dynamic on initial stand. improved a bit with reps and use of walker.                           ADL either performed or assessed with clinical judgement   ADL                                               Vision Baseline Vision/History: 1 Wears glasses       Perception     Praxis      Pertinent Vitals/Pain Pain Assessment Pain  Assessment: No/denies pain     Hand Dominance     Extremity/Trunk Assessment Upper Extremity Assessment Upper Extremity Assessment: Generalized weakness   Lower Extremity Assessment Lower Extremity Assessment: Defer to PT evaluation   Cervical / Trunk Assessment Cervical / Trunk Assessment: Kyphotic   Communication Communication Communication: HOH   Cognition Arousal/Alertness: Awake/alert Behavior During Therapy: Flat affect Overall Cognitive Status: Impaired/Different from  baseline Area of Impairment: Orientation, Attention, Memory, Following commands, Safety/judgement, Awareness, Problem solving                 Orientation Level: Disoriented to, Time, Situation Current Attention Level: Sustained Memory: Decreased short-term memory Following Commands: Follows one step commands inconsistently Safety/Judgement: Decreased awareness of safety, Decreased awareness of deficits Awareness: Intellectual Problem Solving: Slow processing, Decreased initiation, Difficulty sequencing, Requires verbal cues General Comments: difficulty sequencing and problem solving. decreased initiation. impaired memory (suspect acute on chronic)     General Comments  pt's daughter present and supportive throughout session. Daughter reports family able to continue to care for pt at home.    Exercises     Shoulder Instructions      Home Living Family/patient expects to be discharged to:: Private residence Living Arrangements: Children Available Help at Discharge: Family;Available 24 hours/day Type of Home: House             Bathroom Shower/Tub: Sponge bathes at baseline   Bathroom Toilet: Standard Bathroom Accessibility: Yes   Home Equipment: Cane - single point   Additional Comments: lives with daughter and son      Prior Functioning/Environment Prior Level of Function : Independent/Modified Independent             Mobility Comments: pt reports independence with mobility, daughter reports family has been talking with her about using AD for mobility ADLs Comments: sponge bathes at baseline. mod I with basic ADLs        OT Problem List: Decreased strength;Decreased activity tolerance;Impaired balance (sitting and/or standing);Decreased cognition;Decreased safety awareness;Decreased knowledge of use of DME or AE;Decreased knowledge of precautions      OT Treatment/Interventions: Self-care/ADL training;Energy conservation;DME and/or AE  instruction;Therapeutic activities;Cognitive remediation/compensation;Patient/family education;Balance training    OT Goals(Current goals can be found in the care plan section) Acute Rehab OT Goals Patient Stated Goal: pt to return home OT Goal Formulation: With patient/family Time For Goal Achievement: 02/17/22 Potential to Achieve Goals: Good ADL Goals Pt Will Perform Lower Body Bathing: with supervision;sit to/from stand;with set-up Pt Will Perform Lower Body Dressing: with set-up;with supervision;sit to/from stand Pt Will Transfer to Toilet: with supervision;ambulating Pt Will Perform Toileting - Clothing Manipulation and hygiene: with supervision;sit to/from stand;sitting/lateral leans;with min guard assist  OT Frequency: Min 2X/week    Co-evaluation              AM-PAC OT "6 Clicks" Daily Activity     Outcome Measure Help from another person eating meals?: None Help from another person taking care of personal grooming?: A Little Help from another person toileting, which includes using toliet, bedpan, or urinal?: A Little Help from another person bathing (including washing, rinsing, drying)?: A Little Help from another person to put on and taking off regular upper body clothing?: None Help from another person to put on and taking off regular lower body clothing?: A Little 6 Click Score: 20   End of Session Equipment Utilized During Treatment: Rolling walker (2 wheels) Nurse Communication: Mobility status;Other (comment) (bleeding noted from LUE IV site.)  Activity Tolerance: Patient limited by fatigue  Patient left: in chair;with call bell/phone within reach;with chair alarm set;with family/visitor present  OT Visit Diagnosis: Unsteadiness on feet (R26.81);Muscle weakness (generalized) (M62.81);Other symptoms and signs involving cognitive function                Time: 7829-5621 OT Time Calculation (min): 44 min Charges:  OT General Charges $OT Visit: 1 Visit OT  Evaluation $OT Eval Low Complexity: 1 Low OT Treatments $Self Care/Home Management : 23-37 mins  Raynald Kemp, OT Acute Rehabilitation Services Office: [phone]   Pilar Grammes 02/03/2022, 2:05 PM

## 2022-02-03 NOTE — Progress Notes (Signed)
Daily Progress Note Intern Pager: [phone]  Patient name: Amy Roth Medical record number: [id number] Date of birth: 1933/08/03 Age: 88 y.o. Gender: female  Primary Care Provider: Margaretann Loveless, MD Consultants: n/a Code Status: DNR  Pt Overview and Major Events to Date:  8/3 - admitted  Assessment and Plan:  Amy Roth is an 88yo F p/w AMS in the setting of low serum sodium. May be 2/2 SIADH vs hypothyroidism vs glucocorticoid deficiency vs dehydration. CT head with no acute findings, facial droop is chronic. Dementia is possible given functional decline over past year. Pertinent PMH/PSH includes HTN, hypothyroidism.    * Hyponatremia Presumed chronic with no recent labs to compare, symptomatic. Vitals stable. Sodium of 121 on admission with x1 episode of emesis and confusion. Most likely hypovolemic hyponatremia. Improved with fluids earlier, less likely to be SIADH.  -NS bolus, recheck BMP afterwards then tomorrow AM -Maximum increase in sodium in 24hrs - goal 130 -Held Paroxetine given possible SIADH -TSH wnl, Cortisol pending -Strict I/O -Vitals per floor -Cardiac monitoring  Altered mental state Acute, stable. Patient is oriented to person and place but not time which is different from her baseline. She has no focal deficits. CT head negative for acute intracranial process. Likely due to chronic hyponatremia. Underlying mild cognitive impairment or dementia is likely also playing a role. -Consider MRI if treatment of hyponatremia does not improve mental status -Delirium precautions -PT/OT -Follow-up urine culture  Hyperglycemia Glucose elevated to 250s on admission. No history of diabetes.  -Follow with BMPs -A1c -Consider SSI       FEN/GI: Colace PPx: Lovenox Dispo:Home pending clinical improvement . Barriers include improvement in sodium.   Subjective:  Pt feels ok this morning, does not enjoy having her catheter in place.  Otherwise no concerns. Daughter in the room, states pt overall is about the same as when she came in.   Objective: Temp:  [97.4 F (36.3 C)-98.6 F (37 C)] 98.6 F (37 C) (08/04 0734) Pulse Rate:  [65-88] 70 (08/04 0734) Resp:  [16-23] 16 (08/04 0734) BP: (132-162)/(71-101) 150/87 (08/04 0734) SpO2:  [90 %-94 %] 91 % (08/04 0734) Weight:  [72.6 kg] 72.6 kg (08/03 1636) Physical Exam: General: A&O to place, but not month or year, states Feb 2004 Cardiovascular: RRR, no m/r/g Respiratory: CTAB, normal WOB Abdomen: Soft, NT/ND Extremities: No peripheral edema Neuro: L sided facial droop (chronic), 5/5 strength in b/l upper/lower extremities  Laboratory: Most recent CBC Lab Results  Component Value Date   WBC 14.1 (H) 02/03/2022   HGB 16.6 (H) 02/03/2022   HCT 44.4 02/03/2022   MCV 83.5 02/03/2022   PLT 274 02/03/2022   Most recent BMP    Latest Ref Rng & Units 02/03/2022    9:24 AM  BMP  Glucose 70 - 99 mg/dL 324   BUN 8 - 23 mg/dL 6   Creatinine 4.01 - 0.27 mg/dL 2.53   Sodium 664 - 403 mmol/L 127   Potassium 3.5 - 5.1 mmol/L 4.2   Chloride 98 - 111 mmol/L 94   CO2 22 - 32 mmol/L 21   Calcium 8.9 - 10.3 mg/dL 9.2     Other pertinent labs  A1c 5.6 TSH 0.940  Imaging/Diagnostic Tests: CT HEAD Radiologist Impression: No acute intracranial abnormality.  Vonna Drafts, MD 02/03/2022, 10:34 AM  PGY-1, [facility] Family Medicine FPTS Intern pager: [phone], text pages welcome Secure chat group [facility] Teaching Service

## 2022-02-03 NOTE — Progress Notes (Incomplete)
Daily Progress Note Intern Pager: [phone]  Patient name: OONAGH TOELLNER Medical record number: [id number] Date of birth: [birth year] Age: 88 y.o. Gender: female  Primary Care Provider: Miki Kins, FNP Consultants: none Code Status: DNR  Pt Overview and Major Events to Date:  8/3 - Admitted  Assessment and Plan:  Ramsha Coller is an 88yo F p/w AMS in the setting of hyponatremia likely 2/2 dehydration. Pertinent PMH/PSH includes HTN, hypothyroidism.  * Hyponatremia Presumed chronic with no recent labs to compare, symptomatic. Vitals stable. Sodium of 121 on admission with x1 episode of emesis and confusion. Most likely hypovolemic hyponatremia. Improved with fluids earlier, less likely to be SIADH.  -NS bolus, recheck BMP afterwards then tomorrow AM -Maximum increase in sodium in 24hrs - goal 130 -Held Paroxetine given possible SIADH -TSH wnl, Cortisol pending -Strict I/O -Vitals per floor -Cardiac monitoring  Altered mental state Acute, stable. Patient is oriented to person and place but not time which is different from her baseline. She has no focal deficits. CT head negative for acute intracranial process. Likely due to chronic hyponatremia. Underlying mild cognitive impairment or dementia is likely also playing a role. -Consider MRI if treatment of hyponatremia does not improve mental status -Delirium precautions -PT/OT -Follow-up urine culture  Hyperglycemia Glucose elevated to 250s on admission. No history of diabetes.  -Follow with BMPs -A1c -Consider SSI       FEN/GI: Regular PPx: Lovenox Dispo: Home with home health  Subjective:  ***  Objective: Temp:  [97.4 F (36.3 C)-98.6 F (37 C)] 98.3 F (36.8 C) (08/04 1952) Pulse Rate:  [57-89] 57 (08/04 1952) Resp:  [16-18] 16 (08/04 1952) BP: (114-155)/(73-101) 153/75 (08/04 1952) SpO2:  [90 %-98 %] 94 % (08/04 1952) Physical Exam: General: *** Cardiovascular:  *** Respiratory: *** Abdomen: *** Extremities: ***  Laboratory: Most recent CBC Lab Results  Component Value Date   WBC 14.1 (H) 02/03/2022   HGB 16.6 (H) 02/03/2022   HCT 44.4 02/03/2022   MCV 83.5 02/03/2022   PLT 274 02/03/2022   Most recent BMP    Latest Ref Rng & Units 02/03/2022    1:21 PM  BMP  Glucose 70 - 99 mg/dL 010   BUN 8 - 23 mg/dL 6   Creatinine 9.32 - 3.55 mg/dL 7.32   Sodium 202 - 542 mmol/L 127   Potassium 3.5 - 5.1 mmol/L 4.3   Chloride 98 - 111 mmol/L 96   CO2 22 - 32 mmol/L 21   Calcium 8.9 - 10.3 mg/dL 9.0     Other pertinent labs ***   Imaging/Diagnostic Tests: No new imaging.  Celine Mans, MD 02/04/22 ***  PGY-1, [place] [facility] FPTS Intern pager: [phone], text pages welcome Secure chat group [facility] Teaching Service

## 2022-02-03 NOTE — TOC Initial Note (Signed)
Transition of Care [facility]) - Initial/Assessment Note    Patient Details  Name: Amy Roth MRN: [id number] Date of Birth: 1933-12-05  Transition of [facility]) CM/SW Contact:    Kermit Balo, RN Phone Number: 02/03/2022, 3:17 PM  Clinical Narrative:                 Pt is from home alone but her daughter stays overnight and her son is in and out several times during the day. She doesn't use any DME at home.  Daughter provides needed transportation. Daughter also oversees her medications.  Youth walker for home ordered through [employer] and will be delivered to the room.  Home health services arranged through [facility]. Information on the AVS.  Pt has transport home when medically ready.   Expected Discharge Plan: Home w Home Health Services Barriers to Discharge: Continued Medical Work up   Patient Goals and CMS Choice   CMS Medicare.gov Compare Post Acute Care list provided to:: Patient Choice offered to / list presented to : Patient, Adult Children  Expected Discharge Plan and Services Expected Discharge Plan: Home w Home Health Services   Discharge Planning Services: CM Consult Post Acute Care Choice: Home Health Living arrangements for the past 2 months: Single Family Home                 DME Arranged: Environmental consultant youth DME Agency: [employer] Date DME Agency Contacted: 02/03/22   Representative spoke with at DME Agency: Lawernce Keas HH Arranged: PT, OT HH Agency: [facility] Date [facility] Agency Contacted: 02/03/22   Representative spoke with at [facility] Agency: [place]  Prior Living Arrangements/Services Living arrangements for the past 2 months: Single Family Home Lives with:: Self Patient language and need for interpreter reviewed:: Yes Do you feel safe going back to the place where you live?: Yes      Need for Family Participation in Patient Care: Yes (Comment)     Criminal Activity/Legal Involvement Pertinent to Current  Situation/Hospitalization: No - Comment as needed  Activities of Daily Living Home Assistive Devices/Equipment: None ADL Screening (condition at time of admission) Patient's cognitive ability adequate to safely complete daily activities?: Yes Is the patient deaf or have difficulty hearing?: No Does the patient have difficulty seeing, even when wearing glasses/contacts?: No Does the patient have difficulty concentrating, remembering, or making decisions?: No Patient able to express need for assistance with ADLs?: Yes Does the patient have difficulty dressing or bathing?: No Independently performs ADLs?: Yes (appropriate for developmental age) Does the patient have difficulty walking or climbing stairs?: Yes Weakness of Legs: Both Weakness of Arms/Hands: None  Permission Sought/Granted                  Emotional Assessment Appearance:: Appears stated age   Affect (typically observed): Calm Orientation: : Oriented to Self, Oriented to Place, Oriented to Situation   Psych Involvement: No (comment)  Admission diagnosis:  Hyponatremia [E87.1] Altered mental state [R41.82] Altered mental status [R41.82] Patient Active Problem List   Diagnosis Date Noted   Altered mental status 02/03/2022   Dehydration    Altered mental state 02/02/2022   Hyponatremia 02/02/2022   Hyperglycemia 02/02/2022   PCP:  Miki Kins, FNP Pharmacy:   [facility] [address] [place] - [address] [place] [address] Phone: [phone] Fax: [phone]     Social Determinants of Health (SDOH) Interventions    Readmission Risk Interventions     No  data to display

## 2022-02-03 NOTE — Discharge Instructions (Addendum)
Dear Amy Roth,  Thank you for letting us participate in your care. You were hospitalized for confusion and diagnosed with Hyponatremia (low blood sodium). You were treated with IV fluids and monitored. We suspect that your low sodium was due to dehydration. You responded with to fluids. We would encourage you to continue to stay hydrated but avoid drinking too much plain water as this can also drop your sodium. Try to eat regular meals with snacks in between. Arrange an outpatient visit with your primary care physician for repeat blood tests. We have also ordered home health physical therapy and occupational therapy to work with you.  POST-HOSPITAL & CARE INSTRUCTIONS Please continue stay hydrated at home and eat well. Continue your home medications. Please make a follow-up appointment with your primary care provider to recheck your sodium. Go to your follow up appointments (listed below)   DOCTOR'S APPOINTMENT   No future appointments.  Follow-up Information     [facility] [place] Follow up.   Specialty: Home Health Services Why: The home health agency will contact you for the first home visit. Contact information: [address] [place] [address] [phone]                 Take care and be well!  [facility]  [place], [place] [address] [phone]

## 2022-02-04 ENCOUNTER — Encounter: Payer: Self-pay | Admitting: Family Medicine

## 2022-02-04 DIAGNOSIS — E871 Hypo-osmolality and hyponatremia: Secondary | ICD-10-CM

## 2022-02-04 LAB — BASIC METABOLIC PANEL
Anion gap: 12 (ref 5–15)
BUN: 11 mg/dL (ref 8–23)
CO2: 23 mmol/L (ref 22–32)
Calcium: 9.3 mg/dL (ref 8.9–10.3)
Chloride: 93 mmol/L — ABNORMAL LOW (ref 98–111)
Creatinine, Ser: 0.91 mg/dL (ref 0.44–1.00)
GFR, Estimated: >60 mL/min (ref >60)
Glucose, Bld: 112 mg/dL — ABNORMAL HIGH (ref 70–99)
Potassium: 3.9 mmol/L (ref 3.5–5.1)
Sodium: 128 mmol/L — ABNORMAL LOW (ref 135–145)

## 2022-02-04 MED ORDER — PAROXETINE HCL 20 MG PO TABS
20.0000 mg | ORAL_TABLET | Freq: Every day | ORAL | Status: DC
  Administered 2022-02-04: 20 mg via ORAL
  Filled 2022-02-04: qty 1

## 2022-02-04 NOTE — Discharge Summary (Signed)
Family Medicine Teaching [facility] Discharge Summary  Patient name: Amy Roth Medical record number: [id number] Date of birth: Jun 05, 1934 Age: 88 y.o. Gender: female Date of Admission: 02/02/2022  Date of Discharge: 02/04/2022 Admitting Physician: Moses Manners, MD  Primary Care Provider: Miki Kins, FNP Consultants: none  Indication for Hospitalization: AMS  Brief Hospital Course:  Amy Roth is an 88yo F admitted to the inpatient Family Medicine Teaching Service from 8/3 - 8/5 due to altered mental status with hyponatremia. Her hospital course is outlined below.  Hyponatremia Sodium 121 on admission, improved with fluid boluses x2 and sodium improved to 128 by discharge. Urine osm 263, AM cortisol 26.2. Thought to be primarily related to hypovolemia given recently decreased fluid intake. Presumed to be chronic but no recent labs to compare. Her paroxetine was held during her stay due to initial concern for SIADH but this was continued after sodium improvement with oral intake. At discharge patient was tolerating PO, mental status slightly improved. Son at the bedside declined repeat afternoon BMP prior to d/c. Family will help to arrange outpatient follow up with PCP for repeat lab work.   Altered mental state Patient was oriented to person and place but not time. No focal neuro deficits and CT head negative for acute intracranial process. Thought to be progression of chronic underlying cognitive impairment vs dementia vs hyponatremia. Pt with difficulty with short term memory and has been slowly declining in function over the last several months. Suspect also underlying component of demential.   Chronic Stable Conditions: Hypothyroidism - Continued Levothyroxine daily Hypertension - Continued Amlodipine, Benazepril, Metoprolol, Aspirin Hyperlipidemia - Continued Simvastatin   Follow up recommendations: Consider evaluation for dementia; pt  with reported functional decline over several months with short term memory issues. Recheck BMP to monitor continued improvement in hyponatremia. Follow-Up Urine Culture  Disposition: home with home health  Discharge Condition: stable  Discharge Exam:  Vitals:   02/03/22 2320 02/04/22 0410  BP: (!) 155/77 (!) 161/86  Pulse: (!) 56 63  Resp: 20 16  Temp: 98.5 F (36.9 C) 98.3 F (36.8 C)  SpO2: 94% 91%   Physical Exam: General: NAD, sleeping in hospital bed Neuro: Oriented to person and place but not time Cardiovascular: RRR, no murmurs, no peripheral edema Respiratory: normal WOB on room air, CTAB, no wheezes, ronchi or rales Abdomen: soft, NTTP, no rebound or guarding Extremities: Moving all 4 extremities    Significant Procedures: none  Significant Labs and Imaging:  Recent Labs  Lab 02/02/22 1639 02/02/22 1710 02/03/22 0552  WBC 14.9*  --  14.1*  HGB 15.7* 16.7* 16.6*  HCT 43.7 49.0* 44.4  PLT 260  --  274   Recent Labs  Lab 02/02/22 1639 02/02/22 1710 02/03/22 0117 02/03/22 0552 02/03/22 0924 02/03/22 1321 02/04/22 0244  NA 123*   < > 124* 127* 127* 127* 128*  K 3.2*   < > 4.1 4.0 4.2 4.3 3.9  CL 86*   < > 90* 91* 94* 96* 93*  CO2 20*  --  20* 23 21* 21* 23  GLUCOSE 248*   < > 137* 102* 111* 138* 112*  BUN 8   < > 7* 5* 6* 6* 11  CREATININE 0.88   < > 0.69 0.70 0.68 0.93 0.91  CALCIUM 8.9  --  9.0 9.4 9.2 9.0 9.3  ALKPHOS 62  --   --   --   --   --   --  AST 41  --   --   --   --   --   --   ALT 52*  --   --   --   --   --   --   ALBUMIN 3.9  --   --   --   --   --   --    < > = values in this interval not displayed.   Serum Osm - 263 Urine Osm - 258 Urine Na - 88 AM Cortisol - 26.2 TSH - 0.940  Pertinent Imaging    CT HEAD WO CONTRAST  IMPRESSION: No acute intracranial abnormality.   Results/Tests Pending at Time of Discharge: Urine Culture  Discharge Medications:  Allergies as of 02/04/2022       Reactions   Penicillins Other  (See Comments)   Unknown        Medication List     STOP taking these medications    sulfamethoxazole-trimethoprim 800-160 MG tablet Commonly known as: BACTRIM DS       TAKE these medications    amLODipine 5 MG tablet Commonly known as: NORVASC Take 5 mg by mouth daily.   aspirin EC 81 MG tablet Take 81 mg by mouth at bedtime. Swallow whole.   benazepril 20 MG tablet Commonly known as: LOTENSIN Take 20 mg by mouth at bedtime.   docusate sodium 100 MG capsule Commonly known as: COLACE Take 100 mg by mouth 2 (two) times daily as needed for mild constipation.   estradiol 0.1 MG/GM vaginal cream Commonly known as: ESTRACE Place 1 g vaginally once a week. Wednesday   levothyroxine 75 MCG tablet Commonly known as: SYNTHROID Take 75 mcg by mouth daily before breakfast.   metoprolol succinate 25 MG 24 hr tablet Commonly known as: TOPROL-XL Take 25 mg by mouth daily.   PARoxetine 20 MG tablet Commonly known as: PAXIL Take 20 mg by mouth daily.   simvastatin 20 MG tablet Commonly known as: ZOCOR Take 20 mg by mouth at bedtime.   Vitamin D (Ergocalciferol) 1.25 MG (50000 UNIT) Caps capsule Commonly known as: DRISDOL Take 50,000 Units by mouth every 7 (seven) days.               Durable Medical Equipment  (From admission, onward)           Start     Ordered   02/03/22 1515  For home use only DME Walker youth  Once       Question:  Patient needs a walker to treat with the following condition  Answer:  Hyponatremia   02/03/22 1514   02/03/22 1427  For home use only DME Walker rolling  Once       Question Answer Comment  Walker: With 5 Inch Wheels   Patient needs a walker to treat with the following condition Unstable balance      02/03/22 1427            Discharge Instructions: Please refer to Patient Instructions section of EMR for full details.  Patient was counseled important signs and symptoms that should prompt return to medical care,  changes in medications, dietary instructions, activity restrictions, and follow up appointments.   Follow-Up Appointments:  Follow-up Information     [facility] Follow up.   Specialty: Home Health Services Why: The home health agency will contact you for the first home visit. Contact information: [address] [place] [address] [phone]  Sabino Dick, DO 02/04/2022, 1:39 PM PGY-3, [facility]

## 2022-02-04 NOTE — TOC Transition Note (Signed)
Transition of Care [facility] - CM/SW Discharge Note   Patient Details  Name: Amy Roth MRN: [id number] Date of Birth: 1933-08-28  Transition of Care [facility]) CM/SW Contact:  Lawerance Sabal, RN Phone Number: 02/04/2022, 1:48 PM   Clinical Narrative:    [facility] notified of DC No other TOC needs identified for DC      Barriers to Discharge: Continued Medical Work up   Patient Goals and CMS Choice   CMS Medicare.gov Compare Post Acute Care list provided to:: Patient Choice offered to / list presented to : Patient, Adult Children  Discharge Placement                       Discharge Plan and Services   Discharge Planning Services: CM Consult Post Acute Care Choice: Home Health          DME Arranged: Dan Humphreys youth DME Agency: [employer] Date DME Agency Contacted: 02/03/22   Representative spoke with at DME Agency: Lawernce Keas HH Arranged: PT, OT HH Agency: [facility] Date [facility] Agency Contacted: 02/03/22   Representative spoke with at [facility] Agency: [place]  Social Determinants of Health (SDOH) Interventions     Readmission Risk Interventions     No data to display

## 2022-02-06 ENCOUNTER — Encounter: Payer: Self-pay | Admitting: Emergency Medicine

## 2022-02-06 LAB — URINE CULTURE: Culture: >=100000 — AB

## 2022-02-07 DIAGNOSIS — Z87440 Personal history of urinary (tract) infections: Secondary | ICD-10-CM | POA: Diagnosis not present

## 2022-02-07 DIAGNOSIS — E039 Hypothyroidism, unspecified: Secondary | ICD-10-CM | POA: Diagnosis not present

## 2022-02-07 DIAGNOSIS — N183 Chronic kidney disease, stage 3 unspecified: Secondary | ICD-10-CM | POA: Diagnosis not present

## 2022-02-07 DIAGNOSIS — E785 Hyperlipidemia, unspecified: Secondary | ICD-10-CM | POA: Diagnosis not present

## 2022-02-07 DIAGNOSIS — Z8673 Personal history of transient ischemic attack (TIA), and cerebral infarction without residual deficits: Secondary | ICD-10-CM | POA: Diagnosis not present

## 2022-02-07 DIAGNOSIS — Z7982 Long term (current) use of aspirin: Secondary | ICD-10-CM | POA: Diagnosis not present

## 2022-02-07 DIAGNOSIS — E860 Dehydration: Secondary | ICD-10-CM | POA: Diagnosis not present

## 2022-02-07 DIAGNOSIS — R5382 Chronic fatigue, unspecified: Secondary | ICD-10-CM | POA: Diagnosis not present

## 2022-02-07 DIAGNOSIS — F02A0 Dementia in other diseases classified elsewhere, mild, without behavioral disturbance, psychotic disturbance, mood disturbance, and anxiety: Secondary | ICD-10-CM | POA: Diagnosis not present

## 2022-02-07 DIAGNOSIS — E871 Hypo-osmolality and hyponatremia: Secondary | ICD-10-CM | POA: Diagnosis not present

## 2022-02-07 DIAGNOSIS — I129 Hypertensive chronic kidney disease with stage 1 through stage 4 chronic kidney disease, or unspecified chronic kidney disease: Secondary | ICD-10-CM | POA: Diagnosis not present

## 2022-02-16 DIAGNOSIS — Z8673 Personal history of transient ischemic attack (TIA), and cerebral infarction without residual deficits: Secondary | ICD-10-CM | POA: Diagnosis not present

## 2022-02-16 DIAGNOSIS — E785 Hyperlipidemia, unspecified: Secondary | ICD-10-CM | POA: Diagnosis not present

## 2022-02-16 DIAGNOSIS — Z87440 Personal history of urinary (tract) infections: Secondary | ICD-10-CM | POA: Diagnosis not present

## 2022-02-16 DIAGNOSIS — N183 Chronic kidney disease, stage 3 unspecified: Secondary | ICD-10-CM | POA: Diagnosis not present

## 2022-02-16 DIAGNOSIS — Z7982 Long term (current) use of aspirin: Secondary | ICD-10-CM | POA: Diagnosis not present

## 2022-02-16 DIAGNOSIS — F02A0 Dementia in other diseases classified elsewhere, mild, without behavioral disturbance, psychotic disturbance, mood disturbance, and anxiety: Secondary | ICD-10-CM | POA: Diagnosis not present

## 2022-02-16 DIAGNOSIS — E871 Hypo-osmolality and hyponatremia: Secondary | ICD-10-CM | POA: Diagnosis not present

## 2022-02-16 DIAGNOSIS — R5382 Chronic fatigue, unspecified: Secondary | ICD-10-CM | POA: Diagnosis not present

## 2022-02-16 DIAGNOSIS — E860 Dehydration: Secondary | ICD-10-CM | POA: Diagnosis not present

## 2022-02-16 DIAGNOSIS — I129 Hypertensive chronic kidney disease with stage 1 through stage 4 chronic kidney disease, or unspecified chronic kidney disease: Secondary | ICD-10-CM | POA: Diagnosis not present

## 2022-02-16 DIAGNOSIS — E039 Hypothyroidism, unspecified: Secondary | ICD-10-CM | POA: Diagnosis not present

## 2022-02-17 DIAGNOSIS — E871 Hypo-osmolality and hyponatremia: Secondary | ICD-10-CM | POA: Diagnosis not present

## 2022-02-17 DIAGNOSIS — F02A0 Dementia in other diseases classified elsewhere, mild, without behavioral disturbance, psychotic disturbance, mood disturbance, and anxiety: Secondary | ICD-10-CM | POA: Diagnosis not present

## 2022-02-17 DIAGNOSIS — Z7982 Long term (current) use of aspirin: Secondary | ICD-10-CM | POA: Diagnosis not present

## 2022-02-17 DIAGNOSIS — Z87440 Personal history of urinary (tract) infections: Secondary | ICD-10-CM | POA: Diagnosis not present

## 2022-02-17 DIAGNOSIS — E785 Hyperlipidemia, unspecified: Secondary | ICD-10-CM | POA: Diagnosis not present

## 2022-02-17 DIAGNOSIS — Z8673 Personal history of transient ischemic attack (TIA), and cerebral infarction without residual deficits: Secondary | ICD-10-CM | POA: Diagnosis not present

## 2022-02-17 DIAGNOSIS — R5382 Chronic fatigue, unspecified: Secondary | ICD-10-CM | POA: Diagnosis not present

## 2022-02-17 DIAGNOSIS — E860 Dehydration: Secondary | ICD-10-CM | POA: Diagnosis not present

## 2022-02-17 DIAGNOSIS — E039 Hypothyroidism, unspecified: Secondary | ICD-10-CM | POA: Diagnosis not present

## 2022-02-17 DIAGNOSIS — I129 Hypertensive chronic kidney disease with stage 1 through stage 4 chronic kidney disease, or unspecified chronic kidney disease: Secondary | ICD-10-CM | POA: Diagnosis not present

## 2022-02-17 DIAGNOSIS — N183 Chronic kidney disease, stage 3 unspecified: Secondary | ICD-10-CM | POA: Diagnosis not present

## 2022-02-23 DIAGNOSIS — Z7982 Long term (current) use of aspirin: Secondary | ICD-10-CM | POA: Diagnosis not present

## 2022-02-23 DIAGNOSIS — N183 Chronic kidney disease, stage 3 unspecified: Secondary | ICD-10-CM | POA: Diagnosis not present

## 2022-02-23 DIAGNOSIS — E860 Dehydration: Secondary | ICD-10-CM | POA: Diagnosis not present

## 2022-02-23 DIAGNOSIS — I129 Hypertensive chronic kidney disease with stage 1 through stage 4 chronic kidney disease, or unspecified chronic kidney disease: Secondary | ICD-10-CM | POA: Diagnosis not present

## 2022-02-23 DIAGNOSIS — Z87440 Personal history of urinary (tract) infections: Secondary | ICD-10-CM | POA: Diagnosis not present

## 2022-02-23 DIAGNOSIS — R5382 Chronic fatigue, unspecified: Secondary | ICD-10-CM | POA: Diagnosis not present

## 2022-02-23 DIAGNOSIS — F02A0 Dementia in other diseases classified elsewhere, mild, without behavioral disturbance, psychotic disturbance, mood disturbance, and anxiety: Secondary | ICD-10-CM | POA: Diagnosis not present

## 2022-02-23 DIAGNOSIS — E039 Hypothyroidism, unspecified: Secondary | ICD-10-CM | POA: Diagnosis not present

## 2022-02-23 DIAGNOSIS — Z8673 Personal history of transient ischemic attack (TIA), and cerebral infarction without residual deficits: Secondary | ICD-10-CM | POA: Diagnosis not present

## 2022-02-23 DIAGNOSIS — E785 Hyperlipidemia, unspecified: Secondary | ICD-10-CM | POA: Diagnosis not present

## 2022-02-23 DIAGNOSIS — E871 Hypo-osmolality and hyponatremia: Secondary | ICD-10-CM | POA: Diagnosis not present

## 2022-03-01 DIAGNOSIS — I129 Hypertensive chronic kidney disease with stage 1 through stage 4 chronic kidney disease, or unspecified chronic kidney disease: Secondary | ICD-10-CM | POA: Diagnosis not present

## 2022-03-01 DIAGNOSIS — F02A0 Dementia in other diseases classified elsewhere, mild, without behavioral disturbance, psychotic disturbance, mood disturbance, and anxiety: Secondary | ICD-10-CM | POA: Diagnosis not present

## 2022-03-01 DIAGNOSIS — R5382 Chronic fatigue, unspecified: Secondary | ICD-10-CM | POA: Diagnosis not present

## 2022-03-01 DIAGNOSIS — Z8673 Personal history of transient ischemic attack (TIA), and cerebral infarction without residual deficits: Secondary | ICD-10-CM | POA: Diagnosis not present

## 2022-03-01 DIAGNOSIS — Z87440 Personal history of urinary (tract) infections: Secondary | ICD-10-CM | POA: Diagnosis not present

## 2022-03-01 DIAGNOSIS — E860 Dehydration: Secondary | ICD-10-CM | POA: Diagnosis not present

## 2022-03-01 DIAGNOSIS — E039 Hypothyroidism, unspecified: Secondary | ICD-10-CM | POA: Diagnosis not present

## 2022-03-01 DIAGNOSIS — E871 Hypo-osmolality and hyponatremia: Secondary | ICD-10-CM | POA: Diagnosis not present

## 2022-03-01 DIAGNOSIS — N183 Chronic kidney disease, stage 3 unspecified: Secondary | ICD-10-CM | POA: Diagnosis not present

## 2022-03-01 DIAGNOSIS — Z7982 Long term (current) use of aspirin: Secondary | ICD-10-CM | POA: Diagnosis not present

## 2022-03-01 DIAGNOSIS — E785 Hyperlipidemia, unspecified: Secondary | ICD-10-CM | POA: Diagnosis not present

## 2022-03-03 DIAGNOSIS — F32A Depression, unspecified: Secondary | ICD-10-CM | POA: Diagnosis not present

## 2022-03-03 DIAGNOSIS — N390 Urinary tract infection, site not specified: Secondary | ICD-10-CM | POA: Diagnosis not present

## 2022-03-03 DIAGNOSIS — Z0000 Encounter for general adult medical examination without abnormal findings: Secondary | ICD-10-CM | POA: Diagnosis not present

## 2022-03-03 DIAGNOSIS — I10 Essential (primary) hypertension: Secondary | ICD-10-CM | POA: Diagnosis not present

## 2022-03-07 DIAGNOSIS — R5382 Chronic fatigue, unspecified: Secondary | ICD-10-CM | POA: Diagnosis not present

## 2022-03-07 DIAGNOSIS — E860 Dehydration: Secondary | ICD-10-CM | POA: Diagnosis not present

## 2022-03-07 DIAGNOSIS — Z87440 Personal history of urinary (tract) infections: Secondary | ICD-10-CM | POA: Diagnosis not present

## 2022-03-07 DIAGNOSIS — F02A0 Dementia in other diseases classified elsewhere, mild, without behavioral disturbance, psychotic disturbance, mood disturbance, and anxiety: Secondary | ICD-10-CM | POA: Diagnosis not present

## 2022-03-07 DIAGNOSIS — E871 Hypo-osmolality and hyponatremia: Secondary | ICD-10-CM | POA: Diagnosis not present

## 2022-03-07 DIAGNOSIS — E785 Hyperlipidemia, unspecified: Secondary | ICD-10-CM | POA: Diagnosis not present

## 2022-03-07 DIAGNOSIS — I129 Hypertensive chronic kidney disease with stage 1 through stage 4 chronic kidney disease, or unspecified chronic kidney disease: Secondary | ICD-10-CM | POA: Diagnosis not present

## 2022-03-07 DIAGNOSIS — N183 Chronic kidney disease, stage 3 unspecified: Secondary | ICD-10-CM | POA: Diagnosis not present

## 2022-03-07 DIAGNOSIS — Z8673 Personal history of transient ischemic attack (TIA), and cerebral infarction without residual deficits: Secondary | ICD-10-CM | POA: Diagnosis not present

## 2022-03-07 DIAGNOSIS — E039 Hypothyroidism, unspecified: Secondary | ICD-10-CM | POA: Diagnosis not present

## 2022-03-07 DIAGNOSIS — Z7982 Long term (current) use of aspirin: Secondary | ICD-10-CM | POA: Diagnosis not present

## 2022-03-22 DIAGNOSIS — H353211 Exudative age-related macular degeneration, right eye, with active choroidal neovascularization: Secondary | ICD-10-CM | POA: Diagnosis not present

## 2022-04-01 DIAGNOSIS — I10 Essential (primary) hypertension: Secondary | ICD-10-CM | POA: Diagnosis not present

## 2022-04-01 DIAGNOSIS — E039 Hypothyroidism, unspecified: Secondary | ICD-10-CM | POA: Diagnosis not present

## 2022-04-01 DIAGNOSIS — E782 Mixed hyperlipidemia: Secondary | ICD-10-CM | POA: Diagnosis not present

## 2022-04-24 DIAGNOSIS — L821 Other seborrheic keratosis: Secondary | ICD-10-CM | POA: Diagnosis not present

## 2022-04-28 ENCOUNTER — Observation Stay (HOSPITAL_COMMUNITY)
Admission: EM | Admit: 2022-04-28 | Discharge: 2022-04-29 | Disposition: A | Discharge status: Home / Self Care | Payer: Medicare Other | Attending: Internal Medicine | Admitting: Internal Medicine

## 2022-04-28 ENCOUNTER — Emergency Department (HOSPITAL_COMMUNITY): Payer: Medicare Other

## 2022-04-28 ENCOUNTER — Other Ambulatory Visit: Payer: Self-pay

## 2022-04-28 ENCOUNTER — Encounter (HOSPITAL_COMMUNITY): Payer: Self-pay | Admitting: Emergency Medicine

## 2022-04-28 DIAGNOSIS — I10 Essential (primary) hypertension: Secondary | ICD-10-CM

## 2022-04-28 DIAGNOSIS — E871 Hypo-osmolality and hyponatremia: Secondary | ICD-10-CM | POA: Insufficient documentation

## 2022-04-28 DIAGNOSIS — I129 Hypertensive chronic kidney disease with stage 1 through stage 4 chronic kidney disease, or unspecified chronic kidney disease: Secondary | ICD-10-CM | POA: Insufficient documentation

## 2022-04-28 DIAGNOSIS — N390 Urinary tract infection, site not specified: Secondary | ICD-10-CM | POA: Diagnosis not present

## 2022-04-28 DIAGNOSIS — K922 Gastrointestinal hemorrhage, unspecified: Secondary | ICD-10-CM | POA: Diagnosis not present

## 2022-04-28 DIAGNOSIS — E559 Vitamin D deficiency, unspecified: Secondary | ICD-10-CM | POA: Diagnosis not present

## 2022-04-28 DIAGNOSIS — Z7982 Long term (current) use of aspirin: Secondary | ICD-10-CM | POA: Insufficient documentation

## 2022-04-28 DIAGNOSIS — R531 Weakness: Secondary | ICD-10-CM | POA: Insufficient documentation

## 2022-04-28 DIAGNOSIS — K920 Hematemesis: Secondary | ICD-10-CM | POA: Diagnosis not present

## 2022-04-28 DIAGNOSIS — Z743 Need for continuous supervision: Secondary | ICD-10-CM | POA: Diagnosis not present

## 2022-04-28 DIAGNOSIS — N200 Calculus of kidney: Secondary | ICD-10-CM | POA: Diagnosis not present

## 2022-04-28 DIAGNOSIS — R410 Disorientation, unspecified: Secondary | ICD-10-CM | POA: Diagnosis not present

## 2022-04-28 DIAGNOSIS — N182 Chronic kidney disease, stage 2 (mild): Secondary | ICD-10-CM | POA: Insufficient documentation

## 2022-04-28 DIAGNOSIS — Z8673 Personal history of transient ischemic attack (TIA), and cerebral infarction without residual deficits: Secondary | ICD-10-CM | POA: Diagnosis not present

## 2022-04-28 DIAGNOSIS — R6889 Other general symptoms and signs: Secondary | ICD-10-CM | POA: Diagnosis not present

## 2022-04-28 DIAGNOSIS — Z86718 Personal history of other venous thrombosis and embolism: Secondary | ICD-10-CM | POA: Diagnosis not present

## 2022-04-28 DIAGNOSIS — R739 Hyperglycemia, unspecified: Secondary | ICD-10-CM | POA: Diagnosis not present

## 2022-04-28 DIAGNOSIS — Z79899 Other long term (current) drug therapy: Secondary | ICD-10-CM | POA: Diagnosis not present

## 2022-04-28 DIAGNOSIS — F32A Depression, unspecified: Secondary | ICD-10-CM | POA: Diagnosis not present

## 2022-04-28 DIAGNOSIS — E039 Hypothyroidism, unspecified: Secondary | ICD-10-CM | POA: Insufficient documentation

## 2022-04-28 DIAGNOSIS — K222 Esophageal obstruction: Secondary | ICD-10-CM | POA: Insufficient documentation

## 2022-04-28 DIAGNOSIS — K5730 Diverticulosis of large intestine without perforation or abscess without bleeding: Secondary | ICD-10-CM | POA: Diagnosis not present

## 2022-04-28 DIAGNOSIS — R112 Nausea with vomiting, unspecified: Principal | ICD-10-CM | POA: Insufficient documentation

## 2022-04-28 DIAGNOSIS — E785 Hyperlipidemia, unspecified: Secondary | ICD-10-CM | POA: Diagnosis not present

## 2022-04-28 DIAGNOSIS — E860 Dehydration: Secondary | ICD-10-CM | POA: Diagnosis not present

## 2022-04-28 HISTORY — DX: Nausea with vomiting, unspecified: R11.2

## 2022-04-28 HISTORY — DX: Personal history of transient ischemic attack (TIA), and cerebral infarction without residual deficits: Z86.73

## 2022-04-28 LAB — URINALYSIS, ROUTINE W REFLEX MICROSCOPIC
Bilirubin Urine: NEGATIVE
Glucose, UA: 50 mg/dL — AB
Ketones, ur: 20 mg/dL — AB
Nitrite: NEGATIVE
Protein, ur: 30 mg/dL — AB
Specific Gravity, Urine: 1.008 (ref 1.005–1.030)
pH: 7 (ref 5.0–8.0)

## 2022-04-28 LAB — COMPREHENSIVE METABOLIC PANEL
ALT: 57 U/L — ABNORMAL HIGH (ref 0–44)
AST: 50 U/L — ABNORMAL HIGH (ref 15–41)
Albumin: 4.2 g/dL (ref 3.5–5.0)
Alkaline Phosphatase: 59 U/L (ref 38–126)
Anion gap: 13 (ref 5–15)
BUN: 8 mg/dL (ref 8–23)
CO2: 26 mmol/L (ref 22–32)
Calcium: 9.8 mg/dL (ref 8.9–10.3)
Chloride: 88 mmol/L — ABNORMAL LOW (ref 98–111)
Creatinine, Ser: 0.66 mg/dL (ref 0.44–1.00)
GFR, Estimated: >60 mL/min (ref >60)
Glucose, Bld: 163 mg/dL — ABNORMAL HIGH (ref 70–99)
Potassium: 4.1 mmol/L (ref 3.5–5.1)
Sodium: 127 mmol/L — ABNORMAL LOW (ref 135–145)
Total Bilirubin: 1.1 mg/dL (ref 0.3–1.2)
Total Protein: 7.8 g/dL (ref 6.5–8.1)

## 2022-04-28 LAB — TYPE AND SCREEN
ABO/RH(D): B POS
Antibody Screen: NEGATIVE

## 2022-04-28 LAB — CBC
HCT: 44.8 % (ref 36.0–46.0)
Hemoglobin: 16.2 g/dL — ABNORMAL HIGH (ref 12.0–15.0)
MCH: 31.4 pg (ref 26.0–34.0)
MCHC: 36.2 g/dL — ABNORMAL HIGH (ref 30.0–36.0)
MCV: 86.8 fL (ref 80.0–100.0)
Platelets: 240 10*3/uL (ref 150–400)
RBC: 5.16 MIL/uL — ABNORMAL HIGH (ref 3.87–5.11)
RDW: 12.6 % (ref 11.5–15.5)
WBC: 19.6 10*3/uL — ABNORMAL HIGH (ref 4.0–10.5)
nRBC: 0 % (ref 0.0–0.2)

## 2022-04-28 LAB — PHOSPHORUS: Phosphorus: 2.4 mg/dL — ABNORMAL LOW (ref 2.5–4.6)

## 2022-04-28 LAB — POC OCCULT BLOOD, ED: Fecal Occult Bld: NEGATIVE

## 2022-04-28 LAB — MAGNESIUM: Magnesium: 2 mg/dL (ref 1.7–2.4)

## 2022-04-28 MED ORDER — SODIUM CHLORIDE 0.9 % IV SOLN
2.0000 g | Freq: Once | INTRAVENOUS | Status: AC
  Administered 2022-04-28: 2 g via INTRAVENOUS
  Filled 2022-04-28: qty 20

## 2022-04-28 MED ORDER — PANTOPRAZOLE SODIUM 40 MG IV SOLR
40.0000 mg | Freq: Once | INTRAVENOUS | Status: AC
  Administered 2022-04-28: 40 mg via INTRAVENOUS
  Filled 2022-04-28: qty 10

## 2022-04-28 MED ORDER — SIMVASTATIN 20 MG PO TABS
20.0000 mg | ORAL_TABLET | Freq: Every day | ORAL | Status: DC
  Administered 2022-04-28: 20 mg via ORAL
  Filled 2022-04-28: qty 1

## 2022-04-28 MED ORDER — AMLODIPINE BESYLATE 5 MG PO TABS
2.5000 mg | ORAL_TABLET | Freq: Every day | ORAL | Status: DC
  Administered 2022-04-28 – 2022-04-29 (×2): 2.5 mg via ORAL
  Filled 2022-04-28 (×2): qty 1

## 2022-04-28 MED ORDER — PAROXETINE HCL 20 MG PO TABS
30.0000 mg | ORAL_TABLET | Freq: Every day | ORAL | Status: DC
  Administered 2022-04-28 – 2022-04-29 (×2): 30 mg via ORAL
  Filled 2022-04-28 (×2): qty 1

## 2022-04-28 MED ORDER — SODIUM CHLORIDE 0.9 % IV SOLN: INTRAVENOUS | Status: DC

## 2022-04-28 MED ORDER — LEVOTHYROXINE SODIUM 75 MCG PO TABS
75.0000 ug | ORAL_TABLET | Freq: Every day | ORAL | Status: DC
  Administered 2022-04-29: 75 ug via ORAL
  Filled 2022-04-28: qty 1

## 2022-04-28 MED ORDER — PANTOPRAZOLE SODIUM 40 MG PO TBEC
40.0000 mg | DELAYED_RELEASE_TABLET | Freq: Two times a day (BID) | ORAL | Status: DC
  Administered 2022-04-28 – 2022-04-29 (×2): 40 mg via ORAL
  Filled 2022-04-28 (×2): qty 1

## 2022-04-28 MED ORDER — PROCHLORPERAZINE EDISYLATE 10 MG/2ML IJ SOLN: 10.0000 mg | Freq: Four times a day (QID) | INTRAMUSCULAR | Status: DC | PRN

## 2022-04-28 MED ORDER — SODIUM CHLORIDE 0.9 % IV BOLUS
1000.0000 mL | Freq: Once | INTRAVENOUS | Status: AC
  Administered 2022-04-28: 1000 mL via INTRAVENOUS

## 2022-04-28 MED ORDER — METOPROLOL SUCCINATE ER 25 MG PO TB24
25.0000 mg | ORAL_TABLET | Freq: Every day | ORAL | Status: DC
  Administered 2022-04-28 – 2022-04-29 (×2): 25 mg via ORAL
  Filled 2022-04-28 (×2): qty 1

## 2022-04-28 MED ORDER — VITAMIN D (ERGOCALCIFEROL) 1.25 MG (50000 UNIT) PO CAPS: 50000.0000 [IU] | ORAL_CAPSULE | ORAL | Status: DC

## 2022-04-28 MED ORDER — SODIUM CHLORIDE 0.9 % IV SOLN
1.0000 g | INTRAVENOUS | Status: DC
  Administered 2022-04-29: 1 g via INTRAVENOUS
  Filled 2022-04-28: qty 10

## 2022-04-28 MED ORDER — IOHEXOL 300 MG/ML  SOLN
100.0000 mL | Freq: Once | INTRAMUSCULAR | Status: AC | PRN
  Administered 2022-04-28: 100 mL via INTRAVENOUS

## 2022-04-28 MED ORDER — ACETAMINOPHEN 650 MG RE SUPP: 650.0000 mg | Freq: Four times a day (QID) | RECTAL | Status: DC | PRN

## 2022-04-28 MED ORDER — BENAZEPRIL HCL 5 MG PO TABS
5.0000 mg | ORAL_TABLET | Freq: Every day | ORAL | Status: DC
  Administered 2022-04-28 – 2022-04-29 (×2): 5 mg via ORAL
  Filled 2022-04-28 (×2): qty 1

## 2022-04-28 MED ORDER — ACETAMINOPHEN 325 MG PO TABS: 650.0000 mg | ORAL_TABLET | Freq: Four times a day (QID) | ORAL | Status: DC | PRN

## 2022-04-28 MED ORDER — ONDANSETRON HCL 4 MG/2ML IJ SOLN
4.0000 mg | Freq: Once | INTRAMUSCULAR | Status: AC
  Administered 2022-04-28: 4 mg via INTRAVENOUS
  Filled 2022-04-28: qty 2

## 2022-04-28 NOTE — Assessment & Plan Note (Signed)
-  Not significant residual deficits -Safe to resume the use of aspirin for secondary prevention -Continue statin and current antihypertensive regimen.

## 2022-04-28 NOTE — H&P (Addendum)
History and Physical    Patient: Amy Roth DOB: [birth year] DOA: 04/28/2022 DOS: the patient was seen and examined on 04/28/2022 PCP: Mechele Claude, FNP  Patient coming from: Home  Chief Complaint:  Chief Complaint  Patient presents with   Weakness   Emesis   HPI: Amy Roth is a 88 y.o. female with medical history significant of anxiety/depression, gastroesophageal flux disease, history of arthritis, hypertension, hyperlipidemia, hypothyroidism and prior history of stroke; who presented to the hospital secondary to generalized weakness, intractable nausea and vomiting and coffee-ground emesis.  Patient reports symptom has been present for the last 2-3 days and after multiple episodes of vomiting in the noticing son Black vomiting material.  Patient had difficulty keeping things down and experienced generalized weakness. No chest pain, no fever, no chills, no sick contacts that she is aware of; patient reports no melena or hematochezia.  Patient reports no abdominal pain, no palpitations and no hematuria. At baseline patient lives home alone with family assistance and care around-the-clock.  In the ED work-up demonstrated elevated WBCs, urinalysis suggesting UTI and a chest x-ray not demonstrating cardiopulmonary process. IV fluids, antiemetics and antibiotics provided in the ED; urine cultures taken prior to antibiotics being initiated.    Gastroenterology service consulted and [facility] hospitalist contacted to place patient in the hospital for further evaluation and management.   Review of Systems: As mentioned in the history of present illness. All other systems reviewed and are negative. Past Medical History:  Diagnosis Date   Anxiety    Arthritis    Arthritis    Complication of anesthesia    hard to wake up   Depression    DVT (deep venous thrombosis) (HCC)    Dysrhythmia    Edema    feet/legs   GERD (gastroesophageal reflux disease)     Heart murmur    Hyperlipidemia    Hypertension    Hypothyroidism    Palpitation    Stroke [facility])    facial   Past Surgical History:  Procedure Laterality Date   BELPHAROPTOSIS REPAIR     BREAST BIOPSY Left 2004   benign   CARDIAC CATHETERIZATION     CATARACT EXTRACTION W/PHACO Left 04/08/2015   Procedure: CATARACT EXTRACTION PHACO AND INTRAOCULAR LENS PLACEMENT ([place]);  Surgeon: Leandrew Koyanagi, MD;  Location: [facility] ORS;  Service: Ophthalmology;  Laterality: Left;  US01:11.3 AP15.9 CDE11.33 FLUID LOT# [id number]   CATARACT EXTRACTION W/PHACO Right 06/03/2015   Procedure: CATARACT EXTRACTION PHACO AND INTRAOCULAR LENS PLACEMENT ([facility]);  Surgeon: Leandrew Koyanagi, MD;  Location: [facility] ORS;  Service: Ophthalmology;  Laterality: Right;  [place]  1:12.8 AP   19.3 CDE  13.23 casette lot #[id number]   CHOLECYSTECTOMY     NECK SURGERY     TUBAL LIGATION     Social History:  reports that she has never smoked. She has never used smokeless tobacco. She reports that she does not drink alcohol and does not use drugs.  Allergies  Allergen Reactions   Penicillins    Penicillins Other (See Comments)    Unknown    Family History  Problem Relation Age of Onset   Hypertension Father    Heart disease Father    Hypertension Mother    Heart disease Mother    Breast cancer Neg Hx     Prior to Admission medications   Medication Sig Start Date End Date Taking? Authorizing Provider  amLODipine (NORVASC) 5 MG tablet Take 5 mg by mouth daily.   Yes  [provider]  aspirin 81 MG tablet Take 81 mg by mouth daily.   Yes [provider]  benazepril (LOTENSIN) 5 MG tablet Take 5 mg by mouth daily. 02/15/22  Yes [provider]  docusate sodium (COLACE) 100 MG capsule Take 100 mg by mouth 2 (two) times daily as needed for mild constipation.   Yes [provider]  levothyroxine (SYNTHROID) 75 MCG tablet Take 1 tablet (75 mcg total) by mouth daily before breakfast.  10/24/20  Yes Elgergawy, Silver Huguenin, MD  meloxicam (MOBIC) 15 MG tablet Take 15 mg by mouth daily.   Yes [provider]  metoprolol succinate (TOPROL-XL) 25 MG 24 hr tablet Take 25 mg by mouth daily.   Yes [provider]  PARoxetine (PAXIL) 30 MG tablet Take 30 mg by mouth daily. 03/23/22  Yes [provider]  simvastatin (ZOCOR) 20 MG tablet Take 20 mg by mouth at bedtime.   Yes [provider]  Vitamin D, Ergocalciferol, (DRISDOL) 1.25 MG (50000 UNIT) CAPS capsule Take 1 capsule by mouth once a week. 08/10/20  Yes [provider]    Physical Exam: Vitals:   04/28/22 1300 04/28/22 1306 04/28/22 1330 04/28/22 1630  BP: (!) 161/81 (!) 142/61 (!) 140/76   Pulse: 70 70 64 67  Resp: (!) '22 17 19 19  '$ Temp:   98.5 F (36.9 C)   TempSrc:      SpO2: 93% 94% 90% 96%   General exam: Alert, awake, oriented x 3;  Respiratory system: Clear to auscultation. Respiratory effort normal. Cardiovascular system:RRR. No murmurs, rubs, gallops. Gastrointestinal system: Abdomen is nondistended, soft and nontender. No organomegaly or masses felt. Normal bowel sounds heard. Central nervous system: Alert and oriented. No focal neurological deficits. Extremities: No cyanosis or clubbing. Skin: No petechiae. Psychiatry: Judgement and insight appear normal. Mood & affect appropriate.   Data Reviewed: CBC: WBCs 19.6, hemoglobin 16.2, platelets count 240 K Comprehensive metabolic panel: Sodium 630, potassium 4.1, chloride 88, bicarb 26, BUN 8, creatinine 0.66, AST 50 and ALT 57. Fecal occult blood test negative.   Assessment and Plan: * N&V (nausea and vomiting) - With concerns for coffee-ground emesis after multiple vomiting episodes -Mallory-Weiss high in the differential -Continue antiemetics -Treat underlying UTI -Provide fluid resuscitation -PPI twice a day has been ordered -GI service consulted and will follow recommendation -Clear liquid diet  allowed. -Avoid the use of NSAIDs and heparin products.  History of stroke - Not significant residual deficits -Holding aspirin in the setting of acute episode of GI bleed.   CKD (chronic kidney disease) stage 2, GFR 60-89 ml/min - Appears to be stable and at baseline -Continue to maintain adequate hydration -Minimize nephrotoxic agents -Follow renal function trend.  Chronic hyponatremia - Slightly worsened than her baseline in the setting of decreased oral intake and GI losses -Provide fluid resuscitation and follow electrolytes trend.  Vitamin D deficiency - Continue treatment with Drisdol.  Generalized weakness - In the setting of dehydration and UTI most likely -Provide fluid resuscitation, antibiotics and follow urine culture -PT evaluation requested.  Hyperlipidemia - Continue Zocor -Heart healthy diet discussed with patient.  Depression - No suicidal ideation or hallucination -Continue the use of Paxil.  Hypothyroidism -Continue Synthroid  Hypertension - Stable overall -Continue adjusted dose of antihypertensive agents.      Advance Care Planning:   Code Status: DNR   Consults: Gastroenterology service  Family Communication: No family at bedside at time of evaluation.  Severity of Illness: The  appropriate patient status for this patient is OBSERVATION. Observation status is judged to be reasonable and necessary in order to provide the required intensity of service to ensure the patient's safety. The patient's presenting symptoms, physical exam findings, and initial radiographic and laboratory data in the context of their medical condition is felt to place them at decreased risk for further clinical deterioration. Furthermore, it is anticipated that the patient will be medically stable for discharge from the hospital within 2 midnights of admission.   Time: 55 minutes  Author: Barton Dubois, MD 04/28/2022 4:58 PM  For on call review [email or web address].

## 2022-04-28 NOTE — H&P (View-Only) (Signed)
Gastroenterology Consult   Referring Provider: No ref. provider found Primary Care Physician:  Mechele Claude, FNP Primary Gastroenterologist:  previously unassigned, Dr. Gala Romney  Amy ID: DARNELLE Roth; [id number]; 1934-01-01   Admit date: 04/28/2022  LOS: 0 days   Date of Consultation: 04/28/2022  Reason for Consultation:  coffee ground emesis  History of Present Illness   Amy Roth is a 88 y.o. year old female with history of anxiety, depression, GERD, HTN, HLD, prediabetes, hypothyroidism who presented to the ED with generalized weakness and nausea vomiting with coffee-ground emesis that started this morning.  Was noted to have small amount of black emesis on her face.  GI consulted for further evaluation.  ED Course: CT A/P with bibasilar atelectasis/scarring, no pleural effusion, aortic atherosclerosis, postcholecystectomy, normal pancreas and spleen, colonic diverticulosis, appendectomy, 3 mm nonobstructing left renal calculus Hemoglobin 16.2 (chronically elevated), normocytic indices WBC 19.6 (WBC 14.1 in August), sodium 127 (appears to have chronic hyponatremia), mildly elevated LFTs with AST 50, ALT 57, normal AP and T. bili   Consult:  Per chart review, Amy has seen Dr. Allen Norris at [facility] ([facility]) in 2007.  No report available.  Per Amy no history of upper endoscopy.   History provided by Amy and Amy's daughter.  Amy has had frequent UTIs over the last 8 months.  Does note a history of GERD but has not been a problem as of recently.  Is not on any antacids, PPI, or H2 blocker.  Denies any frequent NSAID use.  Not currently on any anticoagulants.  Denies history of stroke or DVT.  Reports in efforts to keep her hydrated, they have been buying flavored drink mix to mix with bottles of water for the Amy.  A family member accidentally purchased flavored energy drink mix which the Amy has been consuming daily for the past 5 to 6 days.   Yesterday she was noted to have an episode of jitteriness with some intense indigestion/reflux pains that were unrelieved with any an acids.  She shortly began vomiting thereafter.  Had some normal bilious emesis and had a couple episodes later had a very large black emesis followed by a few more episodes.  This morning she had an episode of black emesis with EMS per Amy's daughter.  Currently she denies any nausea, vomiting, or upper abdominal pain.  Did not have any abdominal pain prior to initial onset.  Denies any melena or BRBPR.  Denies any dysphagia, lack appetite, early satiety.  Amy actually states that she would like to have some chicken nuggets.     Past Medical History:  Diagnosis Date   Anxiety    Arthritis    Arthritis    Complication of anesthesia    hard to wake up   Depression    DVT (deep venous thrombosis) (HCC)    Dysrhythmia    Edema    feet/legs   GERD (gastroesophageal reflux disease)    Heart murmur    Hyperlipidemia    Hypertension    Hypothyroidism    Palpitation    Stroke [facility])    facial    Past Surgical History:  Procedure Laterality Date   BELPHAROPTOSIS REPAIR     BREAST BIOPSY Left 2004   benign   CARDIAC CATHETERIZATION     CATARACT EXTRACTION W/PHACO Left 04/08/2015   Procedure: CATARACT EXTRACTION PHACO AND INTRAOCULAR LENS PLACEMENT ([place]);  Surgeon: Leandrew Koyanagi, MD;  Location: [facility] ORS;  Service: Ophthalmology;  Laterality: Left;  US01:11.3 AP15.9  SAY30.16 FLUID LOT# [id number]   CATARACT EXTRACTION W/PHACO Right 06/03/2015   Procedure: CATARACT EXTRACTION PHACO AND INTRAOCULAR LENS PLACEMENT ([facility]);  Surgeon: Leandrew Koyanagi, MD;  Location: [facility] ORS;  Service: Ophthalmology;  Laterality: Right;  [place]  1:12.8 AP   19.3 CDE  13.23 casette lot #[id number]   CHOLECYSTECTOMY     NECK SURGERY     TUBAL LIGATION      Prior to Admission medications   Medication Sig Start Date End Date Taking? Authorizing Provider  amLODipine  (NORVASC) 5 MG tablet Take 5 mg by mouth daily.   Yes [provider]  aspirin 81 MG tablet Take 81 mg by mouth daily.   Yes [provider]  benazepril (LOTENSIN) 5 MG tablet Take 5 mg by mouth daily. 02/15/22  Yes [provider]  docusate sodium (COLACE) 100 MG capsule Take 100 mg by mouth 2 (two) times daily as needed for mild constipation.   Yes [provider]  levothyroxine (SYNTHROID) 75 MCG tablet Take 1 tablet (75 mcg total) by mouth daily before breakfast. 10/24/20  Yes Elgergawy, Silver Huguenin, MD  meloxicam (MOBIC) 15 MG tablet Take 15 mg by mouth daily.   Yes [provider]  metoprolol succinate (TOPROL-XL) 25 MG 24 hr tablet Take 25 mg by mouth daily.   Yes [provider]  PARoxetine (PAXIL) 30 MG tablet Take 30 mg by mouth daily. 03/23/22  Yes [provider]  simvastatin (ZOCOR) 20 MG tablet Take 20 mg by mouth at bedtime.   Yes [provider]  Vitamin D, Ergocalciferol, (DRISDOL) 1.25 MG (50000 UNIT) CAPS capsule Take 1 capsule by mouth once a week. 08/10/20  Yes [provider]  estradiol (ESTRACE) 0.1 MG/GM vaginal cream Place 1 g vaginally once a week. Wednesday Amy not taking: Reported on 04/28/2022 01/01/22   [provider]  sulfamethoxazole-trimethoprim (BACTRIM DS) 800-160 MG tablet Take 1 tablet by mouth 2 (two) times daily. Amy not taking: Reported on 04/28/2022 10/24/20   Elgergawy, Silver Huguenin, MD    Current Facility-Administered Medications  Medication Dose Route Frequency Provider Last Rate Last Admin   0.9 %  sodium chloride infusion   Intravenous Continuous Barton Dubois, MD       acetaminophen (TYLENOL) tablet 650 mg  650 mg Oral Q6H PRN Barton Dubois, MD       Or   acetaminophen (TYLENOL) suppository 650 mg  650 mg Rectal Q6H PRN Barton Dubois, MD       pantoprazole (PROTONIX) EC tablet 40 mg  40 mg Oral BID Barton Dubois, MD       prochlorperazine (COMPAZINE) injection  10 mg  10 mg Intravenous Q6H PRN Barton Dubois, MD       Current Outpatient Medications  Medication Sig Dispense Refill   amLODipine (NORVASC) 5 MG tablet Take 5 mg by mouth daily.     aspirin 81 MG tablet Take 81 mg by mouth daily.     benazepril (LOTENSIN) 5 MG tablet Take 5 mg by mouth daily.     docusate sodium (COLACE) 100 MG capsule Take 100 mg by mouth 2 (two) times daily as needed for mild constipation.     levothyroxine (SYNTHROID) 75 MCG tablet Take 1 tablet (75 mcg total) by mouth daily before breakfast. 30 tablet 1   meloxicam (MOBIC) 15 MG tablet Take 15 mg by mouth daily.     metoprolol succinate (TOPROL-XL) 25 MG 24 hr tablet Take 25 mg by mouth daily.  PARoxetine (PAXIL) 30 MG tablet Take 30 mg by mouth daily.     simvastatin (ZOCOR) 20 MG tablet Take 20 mg by mouth at bedtime.     Vitamin D, Ergocalciferol, (DRISDOL) 1.25 MG (50000 UNIT) CAPS capsule Take 1 capsule by mouth once a week.     estradiol (ESTRACE) 0.1 MG/GM vaginal cream Place 1 g vaginally once a week. Wednesday (Amy not taking: Reported on 04/28/2022)     sulfamethoxazole-trimethoprim (BACTRIM DS) 800-160 MG tablet Take 1 tablet by mouth 2 (two) times daily. (Amy not taking: Reported on 04/28/2022) 5 tablet 0    Allergies as of 04/28/2022 - Review Complete 04/28/2022  Allergen Reaction Noted   Penicillins  04/05/2015   Penicillins Other (See Comments) 02/02/2022    Family History  Problem Relation Age of Onset   Hypertension Father    Heart disease Father    Hypertension Mother    Heart disease Mother    Breast cancer Neg Hx     Social History   Socioeconomic History   Marital status: Divorced    Spouse name: Not on file   Number of children: Not on file   Years of education: Not on file   Highest education level: Not on file  Occupational History   Not on file  Tobacco Use   Smoking status: Never   Smokeless tobacco: Never  Vaping Use   Vaping Use: Never used  Substance and  Sexual Activity   Alcohol use: Never   Drug use: Never   Sexual activity: Yes    Birth control/protection: None  Other Topics Concern   Not on file  Social History Narrative   ** Merged History Encounter **       Social Determinants of Health   Financial Resource Strain: Not on file  Food Insecurity: Not on file  Transportation Needs: Not on file  Physical Activity: Not on file  Stress: Not on file  Social Connections: Not on file  Intimate Partner Violence: Not on file     Review of Systems   Gen: Denies any fever, chills, loss of appetite, change in weight or weight loss CV: Denies chest pain, heart palpitations, syncope, edema  Resp: Denies shortness of breath with rest, cough, wheezing, coughing up blood, and pleurisy. GI: Denies vomiting blood, jaundice, and fecal incontinence.   Denies dysphagia or odynophagia. GU : Denies urinary burning, blood in urine, urinary frequency, and urinary incontinence. MS: Denies joint pain, limitation of movement, swelling, cramps, and atrophy.  Derm: Denies rash, itching, dry skin, hives. Psych: Denies depression, anxiety, memory loss, hallucinations, and confusion. Heme: Denies bruising or bleeding Neuro:  Denies any headaches, dizziness, paresthesias, shaking  Physical Exam   Vital Signs in last 24 hours: Temp:  [98.5 F (36.9 C)] 98.5 F (36.9 C) (10/27 1330) Pulse Rate:  [64-72] 64 (10/27 1330) Resp:  [17-29] 19 (10/27 1330) BP: (140-161)/(61-83) 140/76 (10/27 1330) SpO2:  [90 %-100 %] 90 % (10/27 1330)    General:   Alert,  Well-developed, well-nourished, pleasant and cooperative in NAD Head:  Normocephalic and atraumatic. Eyes:  Sclera clear, no icterus.   Conjunctiva pink. Ears:  Normal auditory acuity. Lungs:  Clear throughout to auscultation.   No wheezes, crackles, or rhonchi. No acute distress. Heart:  Regular rate and rhythm; no murmurs, clicks, rubs,  or gallops. Abdomen:  Soft, nontender and nondistended. No  masses, hepatosplenomegaly or hernias noted. Normal bowel sounds, without guarding, and without rebound.   Rectal: deferred  Extremities:  Without clubbing or edema. Neurologic:  Alert and  oriented x4. Skin:  Intact without significant lesions or rashes. Psych:  Alert and cooperative. Normal mood and affect.  Intake/Output from previous day: No intake/output data recorded. Intake/Output this shift: No intake/output data recorded.  Labs/Studies   Recent Labs Recent Labs    04/28/22 0948  WBC 19.6*  HGB 16.2*  HCT 44.8  PLT 240   BMET Recent Labs    04/28/22 0948  NA 127*  K 4.1  CL 88*  CO2 26  GLUCOSE 163*  BUN 8  CREATININE 0.66  CALCIUM 9.8   LFT Recent Labs    04/28/22 0948  PROT 7.8  ALBUMIN 4.2  AST 50*  ALT 57*  ALKPHOS 59  BILITOT 1.1   PT/INR No results for input(s): "LABPROT", "INR" in the last 72 hours. Hepatitis Panel No results for input(s): "HEPBSAG", "HCVAB", "HEPAIGM", "HEPBIGM" in the last 72 hours. C-Diff No results for input(s): "CDIFFTOX" in the last 72 hours.  Radiology/Studies CT ABDOMEN PELVIS W CONTRAST  Result Date: 04/28/2022 CLINICAL DATA:  Generalized weakness, nausea, vomiting, and dark colored emesis EXAM: CT ABDOMEN AND PELVIS WITH CONTRAST TECHNIQUE: Multidetector CT imaging of the abdomen and pelvis was performed using the standard protocol following bolus administration of intravenous contrast. RADIATION DOSE REDUCTION: This exam was performed according to the departmental dose-optimization program which includes automated exposure control, adjustment of the mA and/or kV according to Amy size and/or use of iterative reconstruction technique. CONTRAST:  111m OMNIPAQUE IOHEXOL 300 MG/ML  SOLN COMPARISON:  Renal ultrasound dated 03/01/2021 FINDINGS: Lower chest: Bibasilar linear atelectasis/scarring. No pleural effusion or pneumothorax demonstrated. Left atrial enlargement. Coronary artery calcifications. Hepatobiliary: No  focal hepatic lesions. No intra or extrahepatic biliary ductal dilation. Cholecystectomy. Pancreas: No focal lesions or main ductal dilation. Spleen: Normal in size without focal abnormality. Adrenals/Urinary Tract: No adrenal nodules. Nonobstructing 3 mm left renal calculus. No hydronephrosis. Bilateral subcentimeter hypodensities, too small to characterize. No focal bladder wall thickening. Stomach/Bowel: Normal appearance of the stomach. No evidence of bowel wall thickening, distention, or inflammatory changes. Appendectomy. Colonic diverticulosis without acute diverticulitis. Vascular/Lymphatic: Aortic atherosclerosis. No enlarged abdominal or pelvic lymph nodes. Reproductive: No adnexal masses. Other: No free fluid, fluid collection, or free air. Musculoskeletal: No acute or abnormal lytic or blastic osseous lesions. Small fat-containing paraumbilical hernia. IMPRESSION: 1. No acute abdominopelvic findings. 2. Colonic diverticulosis without acute diverticulitis. 3. Nonobstructing 3 mm left renal calculus. 4. Artery calcifications.  Aortic Atherosclerosis (ICD10-I70.0). Electronically Signed   By: LDarrin NipperM.D.   On: 04/28/2022 11:51     Assessment   Amy DOMEis a 88y.o. year old female with history of anxiety, depression, DVT, GERD, HTN, HLD, prediabetes, hypothyroidism, and stroke who presented to the ED with generalized weakness and nausea/vomiting with coffee-ground emesis that started this morning.  Was noted to have small amount of black emesis on her face.  GI consulted for further evaluation.  Coffee ground emesis, N/V: Amy presented via EMS after multiple reports of coffee-ground emesis.  Amy began having primarily vomiting that began after intense reflux episode likely exacerbated by consumption of high caffeine energy drink mix.  She denies any NSAID use and is not currently on any anticoagulation other than a daily baby aspirin.  Does not currently take any H2 blocker,  PPI, or antacid at home on a daily basis.  Differentials include esophagitis, gastritis, duodenitis, peptic ulcer, or Mallory-Weiss tear in the setting of prior multiple  vomiting episodes.  Hemoglobin elevated on admission, likely in the setting of dehydration.  Currently receiving IV fluids and has received a dose of IV PPI.  With suspect for hemoglobin to trend down given hydration and possible lag time given emesis.  Will give clear liquids now, n.p.o. at midnight and plan for EGD tomorrow with Dr. Gala Romney.   I have discussed the risks, alternatives, benefits with regards to but not limited to the risk of reaction to medication, bleeding, infection, perforation and the Amy is agreeable to proceed. Written consent to be obtained.  Discussed with Amy and daughter.  Plan / Recommendations   PPI BID Clear liquid diet Continue to monitor H/H Continue supportive care NPO at midnight EGD tomorrow with Dr. Gala Romney around 10 am     04/28/2022, 3:26 PM  Venetia Night, MSN, FNP-BC, AGACNP-BC [facility]

## 2022-04-28 NOTE — Assessment & Plan Note (Signed)
-  In the setting of dehydration and UTI most likely -Patient advised to continue fluid resuscitation -Complete treatment for UTI following oral antibiotic prescriptions.

## 2022-04-28 NOTE — Assessment & Plan Note (Signed)
-  No suicidal ideation or hallucination -Continue the use of Paxil. -Stable mood.

## 2022-04-28 NOTE — Plan of Care (Signed)
  Problem: Education: Goal: Knowledge of General Education information will improve Description Including pain rating scale, medication(s)/side effects and non-pharmacologic comfort measures Outcome: Progressing   Problem: Health Behavior/Discharge Planning: Goal: Ability to manage health-related needs will improve Outcome: Progressing   

## 2022-04-28 NOTE — Assessment & Plan Note (Signed)
-  Remains stable and at baseline -Patient advised to maintain adequate hydration -Recommended repeat basic metabolic panel to follow electrolytes and renal function.

## 2022-04-28 NOTE — Assessment & Plan Note (Addendum)
-  Continue treatment with Drisdol.

## 2022-04-28 NOTE — ED Provider Notes (Signed)
[facility] EMERGENCY DEPARTMENT Provider Note   CSN: [id number] Arrival date & time: 04/28/22  4315     History {Add pertinent medical, surgical, social history, OB history to HPI:1} Chief Complaint  Patient presents with   Weakness   Emesis    Amy Roth is a 88 y.o. female.  Patient has a history of hypertension and GERD.  She drank accidentally 6 energy drinks yesterday and has been vomiting black coffee-ground material.   Weakness Associated symptoms: vomiting   Emesis      Home Medications Prior to Admission medications   Medication Sig Start Date End Date Taking? Authorizing Provider  amLODipine (NORVASC) 5 MG tablet Take 5 mg by mouth daily.   Yes [provider]  aspirin 81 MG tablet Take 81 mg by mouth daily.   Yes [provider]  benazepril (LOTENSIN) 5 MG tablet Take 5 mg by mouth daily. 02/15/22  Yes [provider]  docusate sodium (COLACE) 100 MG capsule Take 100 mg by mouth 2 (two) times daily as needed for mild constipation.   Yes [provider]  levothyroxine (SYNTHROID) 75 MCG tablet Take 1 tablet (75 mcg total) by mouth daily before breakfast. 10/24/20  Yes Elgergawy, Silver Huguenin, MD  meloxicam (MOBIC) 15 MG tablet Take 15 mg by mouth daily.   Yes [provider]  metoprolol succinate (TOPROL-XL) 25 MG 24 hr tablet Take 25 mg by mouth daily.   Yes [provider]  PARoxetine (PAXIL) 30 MG tablet Take 30 mg by mouth daily. 03/23/22  Yes [provider]  simvastatin (ZOCOR) 20 MG tablet Take 20 mg by mouth at bedtime.   Yes [provider]  Vitamin D, Ergocalciferol, (DRISDOL) 1.25 MG (50000 UNIT) CAPS capsule Take 1 capsule by mouth once a week. 08/10/20  Yes [provider]  estradiol (ESTRACE) 0.1 MG/GM vaginal cream Place 1 g vaginally once a week. Wednesday Patient not taking: Reported on 04/28/2022 01/01/22   [provider]  sulfamethoxazole-trimethoprim  (BACTRIM DS) 800-160 MG tablet Take 1 tablet by mouth 2 (two) times daily. Patient not taking: Reported on 04/28/2022 10/24/20   Elgergawy, Silver Huguenin, MD      Allergies    Penicillins and Penicillins    Review of Systems   Review of Systems  Gastrointestinal:  Positive for vomiting.  Neurological:  Positive for weakness.    Physical Exam Updated Vital Signs BP (!) 140/76   Pulse 64   Temp 98.5 F (36.9 C)   Resp 19   SpO2 90%  Physical Exam  ED Results / Procedures / Treatments   Labs (all labs ordered are listed, but only abnormal results are displayed) Labs Reviewed  COMPREHENSIVE METABOLIC PANEL - Abnormal; Notable for the following components:      Result Value   Sodium 127 (*)    Chloride 88 (*)    Glucose, Bld 163 (*)    AST 50 (*)    ALT 57 (*)    All other components within normal limits  CBC - Abnormal; Notable for the following components:   WBC 19.6 (*)    RBC 5.16 (*)    Hemoglobin 16.2 (*)    MCHC 36.2 (*)    All other components within normal limits  URINALYSIS, ROUTINE W REFLEX MICROSCOPIC - Abnormal; Notable for the following components:   APPearance HAZY (*)    Glucose, UA 50 (*)    Hgb urine dipstick SMALL (*)    Ketones, ur 20 (*)  Protein, ur 30 (*)    Leukocytes,Ua LARGE (*)    Bacteria, UA RARE (*)    All other components within normal limits  URINE CULTURE  MAGNESIUM  PHOSPHORUS  POC OCCULT BLOOD, ED  TYPE AND SCREEN    EKG None  Radiology CT ABDOMEN PELVIS W CONTRAST  Result Date: 04/28/2022 CLINICAL DATA:  Generalized weakness, nausea, vomiting, and dark colored emesis EXAM: CT ABDOMEN AND PELVIS WITH CONTRAST TECHNIQUE: Multidetector CT imaging of the abdomen and pelvis was performed using the standard protocol following bolus administration of intravenous contrast. RADIATION DOSE REDUCTION: This exam was performed according to the departmental dose-optimization program which includes automated exposure control, adjustment of  the mA and/or kV according to patient size and/or use of iterative reconstruction technique. CONTRAST:  190m OMNIPAQUE IOHEXOL 300 MG/ML  SOLN COMPARISON:  Renal ultrasound dated 03/01/2021 FINDINGS: Lower chest: Bibasilar linear atelectasis/scarring. No pleural effusion or pneumothorax demonstrated. Left atrial enlargement. Coronary artery calcifications. Hepatobiliary: No focal hepatic lesions. No intra or extrahepatic biliary ductal dilation. Cholecystectomy. Pancreas: No focal lesions or main ductal dilation. Spleen: Normal in size without focal abnormality. Adrenals/Urinary Tract: No adrenal nodules. Nonobstructing 3 mm left renal calculus. No hydronephrosis. Bilateral subcentimeter hypodensities, too small to characterize. No focal bladder wall thickening. Stomach/Bowel: Normal appearance of the stomach. No evidence of bowel wall thickening, distention, or inflammatory changes. Appendectomy. Colonic diverticulosis without acute diverticulitis. Vascular/Lymphatic: Aortic atherosclerosis. No enlarged abdominal or pelvic lymph nodes. Reproductive: No adnexal masses. Other: No free fluid, fluid collection, or free air. Musculoskeletal: No acute or abnormal lytic or blastic osseous lesions. Small fat-containing paraumbilical hernia. IMPRESSION: 1. No acute abdominopelvic findings. 2. Colonic diverticulosis without acute diverticulitis. 3. Nonobstructing 3 mm left renal calculus. 4. Artery calcifications.  Aortic Atherosclerosis (ICD10-I70.0). Electronically Signed   By: LDarrin NipperM.D.   On: 04/28/2022 11:51    Procedures Procedures  {Document cardiac monitor, telemetry assessment procedure when appropriate:1}  Medications Ordered in ED Medications  0.9 %  sodium chloride infusion (has no administration in time range)  acetaminophen (TYLENOL) tablet 650 mg (has no administration in time range)    Or  acetaminophen (TYLENOL) suppository 650 mg (has no administration in time range)  prochlorperazine  (COMPAZINE) injection 10 mg (has no administration in time range)  pantoprazole (PROTONIX) EC tablet 40 mg (has no administration in time range)  sodium chloride 0.9 % bolus 1,000 mL (1,000 mLs Intravenous New Bag/Given 04/28/22 0959)  ondansetron (ZOFRAN) injection 4 mg (4 mg Intravenous Given 04/28/22 1000)  pantoprazole (PROTONIX) injection 40 mg (40 mg Intravenous Given 04/28/22 1000)  iohexol (OMNIPAQUE) 300 MG/ML solution 100 mL (100 mLs Intravenous Contrast Given 04/28/22 1104)  cefTRIAXone (ROCEPHIN) 2 g in sodium chloride 0.9 % 100 mL IVPB (0 g Intravenous Stopped 04/28/22 1334)    ED Course/ Medical Decision Making/ A&P                           Medical Decision Making Amount and/or Complexity of Data Reviewed Labs: ordered. Radiology: ordered.  Risk Prescription drug management. Decision regarding hospitalization.  Patient with urinary tract infection and persistent nausea vomiting with possible GI bleed.  She will be admitted to medicine  {Document critical care time when appropriate:1} {Document review of labs and clinical decision tools ie heart score, Chads2Vasc2 etc:1}  {Document your independent review of radiology images, and any outside records:1} {Document your discussion with family members, caretakers, and with consultants:1} {Document social determinants of  health affecting pt's care:1} {Document your decision making why or why not admission, treatments were needed:1} Final Clinical Impression(s) / ED Diagnoses Final diagnoses:  Lower urinary tract infectious disease    Rx / DC Orders ED Discharge Orders     None

## 2022-04-28 NOTE — Assessment & Plan Note (Addendum)
-  Continue Zocor -Heart healthy diet discussed with patient.

## 2022-04-28 NOTE — Assessment & Plan Note (Addendum)
-   With concerns for coffee-ground emesis after multiple vomiting episodes. -Mallory-Weiss high in the differential; endoscopic evaluation demonstrating hiatal hernia and esophageal stricture. -Successful dilatation and recommendation for PPI twice a day for 1 month provided by GI. -Patient tolerated diet without problems and has been discharged home with instruction to follow-up with GI as an outpatient. -No further nausea or vomiting reported. -Stable hemoglobin level appreciated.-Clear liquid diet allowed. -Avoid the use of NSAIDs and heparin products.

## 2022-04-28 NOTE — Consult Note (Signed)
Gastroenterology Consult   Referring Provider: No ref. provider found Primary Care Physician:  Mechele Claude, FNP Primary Gastroenterologist:  previously unassigned, Dr. Gala Romney  Patient ID: [id number] Roth; [id number]; Jun 11, 1934   Admit date: 04/28/2022  LOS: 0 days   Date of Consultation: 04/28/2022  Reason for Consultation:  coffee ground emesis  History of Present Illness   Amy Roth is a 88 y.o. year old female with history of anxiety, depression, GERD, HTN, HLD, prediabetes, hypothyroidism who presented to the ED with generalized weakness and nausea vomiting with coffee-ground emesis that started this morning.  Was noted to have small amount of black emesis on her face.  GI consulted for further evaluation.  ED Course: CT A/P with bibasilar atelectasis/scarring, no pleural effusion, aortic atherosclerosis, postcholecystectomy, normal pancreas and spleen, colonic diverticulosis, appendectomy, 3 mm nonobstructing left renal calculus Hemoglobin 16.2 (chronically elevated), normocytic indices WBC 19.6 (WBC 14.1 in August), sodium 127 (appears to have chronic hyponatremia), mildly elevated LFTs with AST 50, ALT 57, normal AP and T. bili   Consult:  Per chart review, patient has seen Dr. Allen Norris at [facility] (GI) in 2007.  No report available.  Per patient no history of upper endoscopy.   History provided by patient and patient's daughter.  Patient has had frequent UTIs over the last 8 months.  Does note a history of GERD but has not been a problem as of recently.  Is not on any antacids, PPI, or H2 blocker.  Denies any frequent NSAID use.  Not currently on any anticoagulants.  Denies history of stroke or DVT.  Reports in efforts to keep her hydrated, they have been buying flavored drink mix to mix with bottles of water for the patient.  A family member accidentally purchased flavored energy drink mix which the patient has been consuming daily for the past 5 to 6 days.   Yesterday she was noted to have an episode of jitteriness with some intense indigestion/reflux pains that were unrelieved with any an acids.  She shortly began vomiting thereafter.  Had some normal bilious emesis and had a couple episodes later had a very large black emesis followed by a few more episodes.  This morning she had an episode of black emesis with EMS per patient's daughter.  Currently she denies any nausea, vomiting, or upper abdominal pain.  Did not have any abdominal pain prior to initial onset.  Denies any melena or BRBPR.  Denies any dysphagia, lack appetite, early satiety.  Patient actually states that she would like to have some chicken nuggets.     Past Medical History:  Diagnosis Date   Anxiety    Arthritis    Arthritis    Complication of anesthesia    hard to wake up   Depression    DVT (deep venous thrombosis) (HCC)    Dysrhythmia    Edema    feet/legs   GERD (gastroesophageal reflux disease)    Heart murmur    Hyperlipidemia    Hypertension    Hypothyroidism    Palpitation    Stroke [facility])    facial    Past Surgical History:  Procedure Laterality Date   BELPHAROPTOSIS REPAIR     BREAST BIOPSY Left 2004   benign   CARDIAC CATHETERIZATION     CATARACT EXTRACTION W/PHACO Left 04/08/2015   Procedure: CATARACT EXTRACTION PHACO AND INTRAOCULAR LENS PLACEMENT ([place]);  Surgeon: Leandrew Koyanagi, MD;  Location: [facility] ORS;  Service: Ophthalmology;  Laterality: Left;  US01:11.3 AP15.9  HFW26.37 FLUID LOT# [id number]   CATARACT EXTRACTION W/PHACO Right 06/03/2015   Procedure: CATARACT EXTRACTION PHACO AND INTRAOCULAR LENS PLACEMENT ([facility]);  Surgeon: Leandrew Koyanagi, MD;  Location: [facility] ORS;  Service: Ophthalmology;  Laterality: Right;  [place]  1:12.8 AP   19.3 CDE  13.23 casette lot #[id number]   CHOLECYSTECTOMY     NECK SURGERY     TUBAL LIGATION      Prior to Admission medications   Medication Sig Start Date End Date Taking? Authorizing Provider  amLODipine  (NORVASC) 5 MG tablet Take 5 mg by mouth daily.   Yes [provider]  aspirin 81 MG tablet Take 81 mg by mouth daily.   Yes [provider]  benazepril (LOTENSIN) 5 MG tablet Take 5 mg by mouth daily. 02/15/22  Yes [provider]  docusate sodium (COLACE) 100 MG capsule Take 100 mg by mouth 2 (two) times daily as needed for mild constipation.   Yes [provider]  levothyroxine (SYNTHROID) 75 MCG tablet Take 1 tablet (75 mcg total) by mouth daily before breakfast. 10/24/20  Yes Elgergawy, Silver Huguenin, MD  meloxicam (MOBIC) 15 MG tablet Take 15 mg by mouth daily.   Yes [provider]  metoprolol succinate (TOPROL-XL) 25 MG 24 hr tablet Take 25 mg by mouth daily.   Yes [provider]  PARoxetine (PAXIL) 30 MG tablet Take 30 mg by mouth daily. 03/23/22  Yes [provider]  simvastatin (ZOCOR) 20 MG tablet Take 20 mg by mouth at bedtime.   Yes [provider]  Vitamin D, Ergocalciferol, (DRISDOL) 1.25 MG (50000 UNIT) CAPS capsule Take 1 capsule by mouth once a week. 08/10/20  Yes [provider]  estradiol (ESTRACE) 0.1 MG/GM vaginal cream Place 1 g vaginally once a week. Wednesday Patient not taking: Reported on 04/28/2022 01/01/22   [provider]  sulfamethoxazole-trimethoprim (BACTRIM DS) 800-160 MG tablet Take 1 tablet by mouth 2 (two) times daily. Patient not taking: Reported on 04/28/2022 10/24/20   Elgergawy, Silver Huguenin, MD    Current Facility-Administered Medications  Medication Dose Route Frequency Provider Last Rate Last Admin   0.9 %  sodium chloride infusion   Intravenous Continuous Barton Dubois, MD       acetaminophen (TYLENOL) tablet 650 mg  650 mg Oral Q6H PRN Barton Dubois, MD       Or   acetaminophen (TYLENOL) suppository 650 mg  650 mg Rectal Q6H PRN Barton Dubois, MD       pantoprazole (PROTONIX) EC tablet 40 mg  40 mg Oral BID Barton Dubois, MD       prochlorperazine (COMPAZINE) injection  10 mg  10 mg Intravenous Q6H PRN Barton Dubois, MD       Current Outpatient Medications  Medication Sig Dispense Refill   amLODipine (NORVASC) 5 MG tablet Take 5 mg by mouth daily.     aspirin 81 MG tablet Take 81 mg by mouth daily.     benazepril (LOTENSIN) 5 MG tablet Take 5 mg by mouth daily.     docusate sodium (COLACE) 100 MG capsule Take 100 mg by mouth 2 (two) times daily as needed for mild constipation.     levothyroxine (SYNTHROID) 75 MCG tablet Take 1 tablet (75 mcg total) by mouth daily before breakfast. 30 tablet 1   meloxicam (MOBIC) 15 MG tablet Take 15 mg by mouth daily.     metoprolol succinate (TOPROL-XL) 25 MG 24 hr tablet Take 25 mg by mouth daily.  PARoxetine (PAXIL) 30 MG tablet Take 30 mg by mouth daily.     simvastatin (ZOCOR) 20 MG tablet Take 20 mg by mouth at bedtime.     Vitamin D, Ergocalciferol, (DRISDOL) 1.25 MG (50000 UNIT) CAPS capsule Take 1 capsule by mouth once a week.     estradiol (ESTRACE) 0.1 MG/GM vaginal cream Place 1 g vaginally once a week. Wednesday (Patient not taking: Reported on 04/28/2022)     sulfamethoxazole-trimethoprim (BACTRIM DS) 800-160 MG tablet Take 1 tablet by mouth 2 (two) times daily. (Patient not taking: Reported on 04/28/2022) 5 tablet 0    Allergies as of 04/28/2022 - Review Complete 04/28/2022  Allergen Reaction Noted   Penicillins  04/05/2015   Penicillins Other (See Comments) 02/02/2022    Family History  Problem Relation Age of Onset   Hypertension Father    Heart disease Father    Hypertension Mother    Heart disease Mother    Breast cancer Neg Hx     Social History   Socioeconomic History   Marital status: Divorced    Spouse name: Not on file   Number of children: Not on file   Years of education: Not on file   Highest education level: Not on file  Occupational History   Not on file  Tobacco Use   Smoking status: Never   Smokeless tobacco: Never  Vaping Use   Vaping Use: Never used  Substance and  Sexual Activity   Alcohol use: Never   Drug use: Never   Sexual activity: Yes    Birth control/protection: None  Other Topics Concern   Not on file  Social History Narrative   ** Merged History Encounter **       Social Determinants of Health   Financial Resource Strain: Not on file  Food Insecurity: Not on file  Transportation Needs: Not on file  Physical Activity: Not on file  Stress: Not on file  Social Connections: Not on file  Intimate Partner Violence: Not on file     Review of Systems   Gen: Denies any fever, chills, loss of appetite, change in weight or weight loss CV: Denies chest pain, heart palpitations, syncope, edema  Resp: Denies shortness of breath with rest, cough, wheezing, coughing up blood, and pleurisy. GI: Denies vomiting blood, jaundice, and fecal incontinence.   Denies dysphagia or odynophagia. GU : Denies urinary burning, blood in urine, urinary frequency, and urinary incontinence. MS: Denies joint pain, limitation of movement, swelling, cramps, and atrophy.  Derm: Denies rash, itching, dry skin, hives. Psych: Denies depression, anxiety, memory loss, hallucinations, and confusion. Heme: Denies bruising or bleeding Neuro:  Denies any headaches, dizziness, paresthesias, shaking  Physical Exam   Vital Signs in last 24 hours: Temp:  [98.5 F (36.9 C)] 98.5 F (36.9 C) (10/27 1330) Pulse Rate:  [64-72] 64 (10/27 1330) Resp:  [17-29] 19 (10/27 1330) BP: (140-161)/(61-83) 140/76 (10/27 1330) SpO2:  [90 %-100 %] 90 % (10/27 1330)    General:   Alert,  Well-developed, well-nourished, pleasant and cooperative in NAD Head:  Normocephalic and atraumatic. Eyes:  Sclera clear, no icterus.   Conjunctiva pink. Ears:  Normal auditory acuity. Lungs:  Clear throughout to auscultation.   No wheezes, crackles, or rhonchi. No acute distress. Heart:  Regular rate and rhythm; no murmurs, clicks, rubs,  or gallops. Abdomen:  Soft, nontender and nondistended. No  masses, hepatosplenomegaly or hernias noted. Normal bowel sounds, without guarding, and without rebound.   Rectal: deferred  Extremities:  Without clubbing or edema. Neurologic:  Alert and  oriented x4. Skin:  Intact without significant lesions or rashes. Psych:  Alert and cooperative. Normal mood and affect.  Intake/Output from previous day: No intake/output data recorded. Intake/Output this shift: No intake/output data recorded.  Labs/Studies   Recent Labs Recent Labs    04/28/22 0948  WBC 19.6*  HGB 16.2*  HCT 44.8  PLT 240   BMET Recent Labs    04/28/22 0948  NA 127*  K 4.1  CL 88*  CO2 26  GLUCOSE 163*  BUN 8  CREATININE 0.66  CALCIUM 9.8   LFT Recent Labs    04/28/22 0948  PROT 7.8  ALBUMIN 4.2  AST 50*  ALT 57*  ALKPHOS 59  BILITOT 1.1   PT/INR No results for input(s): "LABPROT", "INR" in the last 72 hours. Hepatitis Panel No results for input(s): "HEPBSAG", "HCVAB", "HEPAIGM", "HEPBIGM" in the last 72 hours. C-Diff No results for input(s): "CDIFFTOX" in the last 72 hours.  Radiology/Studies CT ABDOMEN PELVIS W CONTRAST  Result Date: 04/28/2022 CLINICAL DATA:  Generalized weakness, nausea, vomiting, and dark colored emesis EXAM: CT ABDOMEN AND PELVIS WITH CONTRAST TECHNIQUE: Multidetector CT imaging of the abdomen and pelvis was performed using the standard protocol following bolus administration of intravenous contrast. RADIATION DOSE REDUCTION: This exam was performed according to the departmental dose-optimization program which includes automated exposure control, adjustment of the mA and/or kV according to patient size and/or use of iterative reconstruction technique. CONTRAST:  155m OMNIPAQUE IOHEXOL 300 MG/ML  SOLN COMPARISON:  Renal ultrasound dated 03/01/2021 FINDINGS: Lower chest: Bibasilar linear atelectasis/scarring. No pleural effusion or pneumothorax demonstrated. Left atrial enlargement. Coronary artery calcifications. Hepatobiliary: No  focal hepatic lesions. No intra or extrahepatic biliary ductal dilation. Cholecystectomy. Pancreas: No focal lesions or main ductal dilation. Spleen: Normal in size without focal abnormality. Adrenals/Urinary Tract: No adrenal nodules. Nonobstructing 3 mm left renal calculus. No hydronephrosis. Bilateral subcentimeter hypodensities, too small to characterize. No focal bladder wall thickening. Stomach/Bowel: Normal appearance of the stomach. No evidence of bowel wall thickening, distention, or inflammatory changes. Appendectomy. Colonic diverticulosis without acute diverticulitis. Vascular/Lymphatic: Aortic atherosclerosis. No enlarged abdominal or pelvic lymph nodes. Reproductive: No adnexal masses. Other: No free fluid, fluid collection, or free air. Musculoskeletal: No acute or abnormal lytic or blastic osseous lesions. Small fat-containing paraumbilical hernia. IMPRESSION: 1. No acute abdominopelvic findings. 2. Colonic diverticulosis without acute diverticulitis. 3. Nonobstructing 3 mm left renal calculus. 4. Artery calcifications.  Aortic Atherosclerosis (ICD10-I70.0). Electronically Signed   By: LDarrin NipperM.D.   On: 04/28/2022 11:51     Assessment   GKARY SUGRUEis a 88y.o. year old female with history of anxiety, depression, DVT, GERD, HTN, HLD, prediabetes, hypothyroidism, and stroke who presented to the ED with generalized weakness and nausea/vomiting with coffee-ground emesis that started this morning.  Was noted to have small amount of black emesis on her face.  GI consulted for further evaluation.  Coffee ground emesis, N/V: Patient presented via EMS after multiple reports of coffee-ground emesis.  Patient began having primarily vomiting that began after intense reflux episode likely exacerbated by consumption of high caffeine energy drink mix.  She denies any NSAID use and is not currently on any anticoagulation other than a daily baby aspirin.  Does not currently take any H2 blocker,  PPI, or antacid at home on a daily basis.  Differentials include esophagitis, gastritis, duodenitis, peptic ulcer, or Mallory-Weiss tear in the setting of prior multiple  vomiting episodes.  Hemoglobin elevated on admission, likely in the setting of dehydration.  Currently receiving IV fluids and has received a dose of IV PPI.  With suspect for hemoglobin to trend down given hydration and possible lag time given emesis.  Will give clear liquids now, n.p.o. at midnight and plan for EGD tomorrow with Dr. Gala Romney.   I have discussed the risks, alternatives, benefits with regards to but not limited to the risk of reaction to medication, bleeding, infection, perforation and the patient is agreeable to proceed. Written consent to be obtained.  Discussed with patient and daughter.  Plan / Recommendations   PPI BID Clear liquid diet Continue to monitor H/H Continue supportive care NPO at midnight EGD tomorrow with Dr. Gala Romney around 10 am     04/28/2022, 3:26 PM  Venetia Night, MSN, FNP-BC, AGACNP-BC [facility]

## 2022-04-28 NOTE — Assessment & Plan Note (Signed)
-  Stable overall -Continue adjusted dose of antihypertensive agents. -Heart healthy diet discussed with patient.

## 2022-04-28 NOTE — Assessment & Plan Note (Addendum)
Continue Synthroid °

## 2022-04-28 NOTE — Assessment & Plan Note (Signed)
-  Slightly worsened than her baseline in the setting of decreased oral intake and GI losses at time of admission. -Stabilized and back to her baseline after fluid resuscitation -Patient advised to maintain adequate hydration.

## 2022-04-28 NOTE — ED Triage Notes (Addendum)
Pt lives at home alone but has family staying 24/7. Gen weakness started yesterday. N/v black emesis started this am. Pt arrived color wnl, alert and oriented to most. Gen weakness noted. Small amount of black emesis noted on face. Pt cleaned.  Pt was given 12.'5mg'$  phenergen in route and cbg was 216

## 2022-04-29 ENCOUNTER — Encounter (HOSPITAL_COMMUNITY): Payer: Self-pay | Admitting: Internal Medicine

## 2022-04-29 ENCOUNTER — Encounter (HOSPITAL_COMMUNITY)
Admission: EM | Disposition: A | Discharge status: Home / Self Care | Payer: Self-pay | Source: Home / Self Care | Attending: Emergency Medicine

## 2022-04-29 ENCOUNTER — Observation Stay (HOSPITAL_COMMUNITY): Payer: Medicare Other | Admitting: Anesthesiology

## 2022-04-29 DIAGNOSIS — E559 Vitamin D deficiency, unspecified: Secondary | ICD-10-CM | POA: Diagnosis not present

## 2022-04-29 DIAGNOSIS — I129 Hypertensive chronic kidney disease with stage 1 through stage 4 chronic kidney disease, or unspecified chronic kidney disease: Secondary | ICD-10-CM | POA: Diagnosis not present

## 2022-04-29 DIAGNOSIS — E871 Hypo-osmolality and hyponatremia: Secondary | ICD-10-CM | POA: Diagnosis not present

## 2022-04-29 DIAGNOSIS — K920 Hematemesis: Secondary | ICD-10-CM | POA: Diagnosis not present

## 2022-04-29 DIAGNOSIS — K222 Esophageal obstruction: Secondary | ICD-10-CM

## 2022-04-29 DIAGNOSIS — N390 Urinary tract infection, site not specified: Secondary | ICD-10-CM | POA: Diagnosis not present

## 2022-04-29 DIAGNOSIS — Z79899 Other long term (current) drug therapy: Secondary | ICD-10-CM | POA: Diagnosis not present

## 2022-04-29 DIAGNOSIS — F32A Depression, unspecified: Secondary | ICD-10-CM | POA: Diagnosis not present

## 2022-04-29 DIAGNOSIS — Z8673 Personal history of transient ischemic attack (TIA), and cerebral infarction without residual deficits: Secondary | ICD-10-CM

## 2022-04-29 DIAGNOSIS — Z86718 Personal history of other venous thrombosis and embolism: Secondary | ICD-10-CM | POA: Diagnosis not present

## 2022-04-29 DIAGNOSIS — E039 Hypothyroidism, unspecified: Secondary | ICD-10-CM | POA: Diagnosis not present

## 2022-04-29 DIAGNOSIS — R531 Weakness: Secondary | ICD-10-CM | POA: Diagnosis not present

## 2022-04-29 DIAGNOSIS — E785 Hyperlipidemia, unspecified: Secondary | ICD-10-CM

## 2022-04-29 DIAGNOSIS — R112 Nausea with vomiting, unspecified: Secondary | ICD-10-CM | POA: Diagnosis not present

## 2022-04-29 DIAGNOSIS — N182 Chronic kidney disease, stage 2 (mild): Secondary | ICD-10-CM | POA: Diagnosis not present

## 2022-04-29 HISTORY — PX: ESOPHAGOGASTRODUODENOSCOPY (EGD) WITH PROPOFOL: SHX5813

## 2022-04-29 LAB — BASIC METABOLIC PANEL
Anion gap: 9 (ref 5–15)
BUN: 10 mg/dL (ref 8–23)
CO2: 25 mmol/L (ref 22–32)
Calcium: 9 mg/dL (ref 8.9–10.3)
Chloride: 103 mmol/L (ref 98–111)
Creatinine, Ser: 0.87 mg/dL (ref 0.44–1.00)
GFR, Estimated: >60 mL/min (ref >60)
Glucose, Bld: 104 mg/dL — ABNORMAL HIGH (ref 70–99)
Potassium: 2.9 mmol/L — ABNORMAL LOW (ref 3.5–5.1)
Sodium: 137 mmol/L (ref 135–145)

## 2022-04-29 LAB — CBC
HCT: 41.8 % (ref 36.0–46.0)
Hemoglobin: 14.6 g/dL (ref 12.0–15.0)
MCH: 31.3 pg (ref 26.0–34.0)
MCHC: 34.9 g/dL (ref 30.0–36.0)
MCV: 89.5 fL (ref 80.0–100.0)
Platelets: 237 10*3/uL (ref 150–400)
RBC: 4.67 MIL/uL (ref 3.87–5.11)
RDW: 13.3 % (ref 11.5–15.5)
WBC: 11.8 10*3/uL — ABNORMAL HIGH (ref 4.0–10.5)
nRBC: 0 % (ref 0.0–0.2)

## 2022-04-29 SURGERY — ESOPHAGOGASTRODUODENOSCOPY (EGD) WITH PROPOFOL
Anesthesia: General

## 2022-04-29 MED ORDER — MELOXICAM 15 MG PO TABS: 15.0000 mg | ORAL_TABLET | Freq: Every day | ORAL | Status: DC

## 2022-04-29 MED ORDER — AMLODIPINE BESYLATE 5 MG PO TABS: 2.5000 mg | ORAL_TABLET | Freq: Every day | ORAL | Status: DC

## 2022-04-29 MED ORDER — PANTOPRAZOLE SODIUM 40 MG PO TBEC: 40.0000 mg | DELAYED_RELEASE_TABLET | Freq: Two times a day (BID) | ORAL | 1 refills | Status: DC

## 2022-04-29 MED ORDER — PROPOFOL 10 MG/ML IV BOLUS
INTRAVENOUS | Status: DC | PRN
  Administered 2022-04-29: 140 mg via INTRAVENOUS

## 2022-04-29 MED ORDER — LACTATED RINGERS IV SOLN: INTRAVENOUS | Status: DC | PRN

## 2022-04-29 MED ORDER — PROPOFOL 10 MG/ML IV BOLUS
INTRAVENOUS | Status: AC
  Filled 2022-04-29: qty 20

## 2022-04-29 MED ORDER — CEFDINIR 300 MG PO CAPS: 300.0000 mg | ORAL_CAPSULE | Freq: Two times a day (BID) | ORAL | 0 refills | Status: AC

## 2022-04-29 NOTE — Anesthesia Preprocedure Evaluation (Signed)
Anesthesia Evaluation  Patient identified by MRN, date of birth, ID band Patient awake    Reviewed: Allergy & Precautions, H&P , NPO status , Patient's Chart, lab work & pertinent test results, reviewed documented beta blocker date and time   History of Anesthesia Complications (+) PROLONGED EMERGENCE and history of anesthetic complications  Airway Mallampati: II  TM Distance: >3 FB Neck ROM: full    Dental no notable dental hx.    Pulmonary neg pulmonary ROS,    Pulmonary exam normal breath sounds clear to auscultation       Cardiovascular Exercise Tolerance: Good hypertension, negative cardio ROS   Rhythm:regular Rate:Normal     Neuro/Psych PSYCHIATRIC DISORDERS Anxiety Depression CVA    GI/Hepatic Neg liver ROS, GERD  Medicated,  Endo/Other  Hypothyroidism   Renal/GU CRFRenal disease  negative genitourinary   Musculoskeletal   Abdominal   Peds  Hematology negative hematology ROS (+)   Anesthesia Other Findings   Reproductive/Obstetrics negative OB ROS                             Anesthesia Physical Anesthesia Plan  ASA: 4 and emergent  Anesthesia Plan: General   Post-op Pain Management:    Induction:   PONV Risk Score and Plan: Propofol infusion  Airway Management Planned:   Additional Equipment:   Intra-op Plan:   Post-operative Plan:   Informed Consent: I have reviewed the patients History and Physical, chart, labs and discussed the procedure including the risks, benefits and alternatives for the proposed anesthesia with the patient or authorized representative who has indicated his/her understanding and acceptance.     Dental Advisory Given  Plan Discussed with: CRNA  Anesthesia Plan Comments:         Anesthesia Quick Evaluation

## 2022-04-29 NOTE — Transfer of Care (Signed)
Immediate Anesthesia Transfer of Care Note  Patient: Amy Roth  Procedure(s) Performed: ESOPHAGOGASTRODUODENOSCOPY (EGD) WITH PROPOFOL  Patient Location: PACU  Anesthesia Type:General  Level of Consciousness: awake  Airway & Oxygen Therapy: Patient Spontanous Breathing  Post-op Assessment: Report given to RN and Post -op Vital signs reviewed and stable  Post vital signs: Reviewed and stable  Last Vitals:  Vitals Value Taken Time  BP 91/58 04/29/22 1048  Temp 98.3   Pulse 62 04/29/22 1049  Resp 17 04/29/22 1049  SpO2 94 % 04/29/22 1049  Vitals shown include unvalidated device data.  Last Pain:  Vitals:   04/29/22 1029  TempSrc:   PainSc: 0-No pain      Patients Stated Pain Goal: 7 (16/10/96 0454)  Complications: No notable events documented.

## 2022-04-29 NOTE — Assessment & Plan Note (Addendum)
-   No dysuria reported at time of discharge. -Final culture results demonstrating Klebsiella pneumonia UTI, microorganism resistant to ampicillin and nitrofurantoin. -Patient responded adequately to empirical treatment with Rocephin and at discharge will continue 4 more days of cefdinir. -Advised to maintain adequate hydration. -Will benefit of outpatient follow-up with urology service; family member expressing symptoms concerning for overreactive bladder.

## 2022-04-29 NOTE — Progress Notes (Signed)
Nsg Discharge Note  Admit Date:  04/28/2022 Discharge date: 04/29/2022   Amy Roth to be D/C'd Home per MD order.  AVS completed.   Removed IV-CDI. [name] reviewed d/c paperwork with patient and friend/caregiver. Wheeled stable patient and belongings to main entrance where she was picked up by her friend. Patient/caregiver able to verbalize understanding.  Discharge Medication: Allergies as of 04/29/2022       Reactions   Penicillins    Penicillins Other (See Comments)   Unknown        Medication List     TAKE these medications    amLODipine 5 MG tablet Commonly known as: NORVASC Take 0.5 tablets (2.5 mg total) by mouth daily. What changed: how much to take   aspirin 81 MG tablet Take 81 mg by mouth daily.   benazepril 5 MG tablet Commonly known as: LOTENSIN Take 5 mg by mouth daily.   cefdinir 300 MG capsule Commonly known as: OMNICEF Take 1 capsule (300 mg total) by mouth 2 (two) times daily for 4 days.   docusate sodium 100 MG capsule Commonly known as: COLACE Take 100 mg by mouth 2 (two) times daily as needed for mild constipation.   levothyroxine 75 MCG tablet Commonly known as: SYNTHROID Take 1 tablet (75 mcg total) by mouth daily before breakfast.   meloxicam 15 MG tablet Commonly known as: MOBIC Take 1 tablet (15 mg total) by mouth daily. Start taking on: May 03, 2022 What changed: These instructions start on May 03, 2022. If you are unsure what to do until then, ask your doctor or other care provider.   metoprolol succinate 25 MG 24 hr tablet Commonly known as: TOPROL-XL Take 25 mg by mouth daily.   pantoprazole 40 MG tablet Commonly known as: PROTONIX Take 1 tablet (40 mg total) by mouth 2 (two) times daily. After one month, can take medication once a day only.   PARoxetine 30 MG tablet Commonly known as: PAXIL Take 30 mg by mouth daily.   simvastatin 20 MG tablet Commonly known as: ZOCOR Take 20 mg by mouth at  bedtime.   Vitamin D (Ergocalciferol) 1.25 MG (50000 UNIT) Caps capsule Commonly known as: DRISDOL Take 1 capsule by mouth once a week.      Nsg Discharge Note  Admit Date:  04/28/2022 Discharge date: 04/29/2022   Amy Roth to be D/C'd Home per MD order.  AVS completed.  Copy for chart, and copy for patient signed, and dated. Patient/caregiver able to verbalize understanding.  Discharge Medication: Allergies as of 04/29/2022       Reactions   Penicillins    Penicillins Other (See Comments)   Unknown        Medication List     TAKE these medications    amLODipine 5 MG tablet Commonly known as: NORVASC Take 0.5 tablets (2.5 mg total) by mouth daily. What changed: how much to take   aspirin 81 MG tablet Take 81 mg by mouth daily.   benazepril 5 MG tablet Commonly known as: LOTENSIN Take 5 mg by mouth daily.   cefdinir 300 MG capsule Commonly known as: OMNICEF Take 1 capsule (300 mg total) by mouth 2 (two) times daily for 4 days.   docusate sodium 100 MG capsule Commonly known as: COLACE Take 100 mg by mouth 2 (two) times daily as needed for mild constipation.   levothyroxine 75 MCG tablet Commonly known as: SYNTHROID Take 1 tablet (75 mcg total) by mouth daily before breakfast.  meloxicam 15 MG tablet Commonly known as: MOBIC Take 1 tablet (15 mg total) by mouth daily. Start taking on: May 03, 2022 What changed: These instructions start on May 03, 2022. If you are unsure what to do until then, ask your doctor or other care provider.   metoprolol succinate 25 MG 24 hr tablet Commonly known as: TOPROL-XL Take 25 mg by mouth daily.   pantoprazole 40 MG tablet Commonly known as: PROTONIX Take 1 tablet (40 mg total) by mouth 2 (two) times daily. After one month, can take medication once a day only.   PARoxetine 30 MG tablet Commonly known as: PAXIL Take 30 mg by mouth daily.   simvastatin 20 MG tablet Commonly known as:  ZOCOR Take 20 mg by mouth at bedtime.   Vitamin D (Ergocalciferol) 1.25 MG (50000 UNIT) Caps capsule Commonly known as: DRISDOL Take 1 capsule by mouth once a week.        Discharge Assessment: Vitals:   04/29/22 1206 04/29/22 1507  BP:  (!) 153/79  Pulse: 67 84  Resp:  20  Temp:  97.8 F (36.6 C)  SpO2:  98%   Skin clean, dry and intact without evidence of skin break down, no evidence of skin tears noted. IV catheter discontinued intact. Site without signs and symptoms of complications - no redness or edema noted at insertion site, patient denies c/o pain - only slight tenderness at site.  Dressing with slight pressure applied.  D/c Instructions-Education: Discharge instructions given to patient/family with verbalized understanding. D/c education completed with patient/family including follow up instructions, medication list, d/c activities limitations if indicated, with other d/c instructions as indicated by MD - patient able to verbalize understanding, all questions fully answered. Patient instructed to return to ED, call 911, or call MD for any changes in condition.  Patient escorted via [place], and D/C home via private auto.  Santa Lighter, RN 04/29/2022 6:15 PM  Discharge Assessment: Vitals:   04/29/22 1206 04/29/22 1507  BP:  (!) 153/79  Pulse: 67 84  Resp:  20  Temp:  97.8 F (36.6 C)  SpO2:  98%   Skin clean, dry and intact without evidence of skin break down, no evidence of skin tears noted. IV catheter discontinued intact. Site without signs and symptoms of complications - no redness or edema noted at insertion site, patient denies c/o pain - only slight tenderness at site.  Dressing with slight pressure applied.  D/c Instructions-Education: Discharge instructions given to patient/family with verbalized understanding. D/c education completed with patient/family including follow up instructions, medication list, d/c activities limitations if indicated, with other  d/c instructions as indicated by MD - patient able to verbalize understanding, all questions fully answered. Patient instructed to return to ED, call 911, or call MD for any changes in condition.  Patient escorted via [place], and D/C home via private auto.  Santa Lighter, RN 04/29/2022 6:15 PM

## 2022-04-29 NOTE — Op Note (Signed)
[facility] Patient Name: Amy Roth Procedure Date: 04/29/2022 9:33 AM MRN: [id number] Date of Birth: 20-May-1934 Attending MD: Norvel Richards , MD, [id number] CSN: [id number] Age: 88 Admit Type: Outpatient Procedure:                Upper GI endoscopy Indications:              Coffee-ground emesis Providers:                Norvel Richards, MD, Lurline Del, RN, Casimer Bilis, Technician Referring MD:              Medicines:                Propofol per Anesthesia Complications:            No immediate complications. Estimated Blood Loss:     Estimated blood loss was minimal. Procedure:                Pre-Anesthesia Assessment:                           - Prior to the procedure, a History and Physical                            was performed, and patient medications and                            allergies were reviewed. The patient's tolerance of                            previous anesthesia was also reviewed. The risks                            and benefits of the procedure and the sedation                            options and risks were discussed with the patient.                            All questions were answered, and informed consent                            was obtained. Prior Anticoagulants: The patient has                            taken no anticoagulant or antiplatelet agents. ASA                            Grade Assessment: III - A patient with severe                            systemic disease. After reviewing the risks and  benefits, the patient was deemed in satisfactory                            condition to undergo the procedure.                           After obtaining informed consent, the endoscope was                            passed under direct vision. Throughout the                            procedure, the patient's blood pressure, pulse, and                             oxygen saturations were monitored continuously. The                            [id number]-H190 ([id number]) scope was introduced through the                            mouth, and advanced to the second part of duodenum.                            The upper GI endoscopy was accomplished without                            difficulty. The patient tolerated the procedure                            well. Scope In: 10:38:57 AM Scope Out: 10:44:15 AM Total Procedure Duration: 0 hours 5 minutes 18 seconds  Findings:      Distal esophageal erosions within 5 mm the GE junction straddling a       Schatzki's ring/soft peptic stricture. Minimal impediment to passage of       the adult gastroscope. No tumor. No Barrett's esophagus seen. Gastric       cavity empty. Small hiatal hernia. Normal gastric mucosa; patent pylorus.      The duodenal bulb and second portion of the duodenum were normal. Scope       was withdrawn and a 54 [place] Maloney dilators was passed to full       insertion with mild resistance. A look back revealed the ring was nicely       dilated with minimal bleeding. Impression:               Mild erosive reflux esophagitis/Schatzki's                            ring/peptic stricture - status post dilation. Small                            hiatal hernia; otherwise, normal-appearing stomach                            and duodenum through the second  portion.                           Trivial upper GI bleed -precipitated by nausea and                            vomiting - likely precipitated by dietary factors. Moderate Sedation:      Moderate (conscious) sedation was personally administered by an       anesthesia professional. The following parameters were monitored: oxygen       saturation, heart rate, blood pressure, respiratory rate, EKG, adequacy       of pulmonary ventilation, and response to care. Recommendation:           - Return patient to hospital ward for observation.                            - Advance diet as tolerated.                           - Continue present medications. Continue Protonix                            40 mg twice daily x1 month; then decrease to 40 mg                            once daily thereafter. Hopefully, discharge in the                            next 24 hours. Commonsense diet recommended.                           - Return to my office in 2 months. At patient                            request, I spoke to Dana Allan, patient's                            friend, in short stay face-to-face. Discussed                            findings and recommendations. Procedure Code(s):        --- Professional ---                           [phone], Esophagogastroduodenoscopy, flexible,                            transoral; diagnostic, including collection of                            specimen(s) by brushing or washing, when performed                            (separate procedure) Diagnosis Code(s):        --- Professional ---  K92.0, Hematemesis CPT copyright 2022 [employer]. All rights reserved. The codes documented in this report are preliminary and upon coder review may  be revised to meet current compliance requirements. Cristopher Estimable. Jonathon Castelo, MD Norvel Richards, MD 04/29/2022 11:18:09 AM This report has been signed electronically. Number of Addenda: 0

## 2022-04-29 NOTE — Interval H&P Note (Signed)
History and Physical Interval Note:  04/29/2022 10:36 AM  Amy Roth  has presented today for surgery, with the diagnosis of coffe ground emesis.  The various methods of treatment have been discussed with the patient and family. After consideration of risks, benefits and other options for treatment, the patient has consented to  Procedure(s): ESOPHAGOGASTRODUODENOSCOPY (EGD) WITH PROPOFOL (N/A) as a surgical intervention.  The patient's history has been reviewed, patient examined, no change in status, stable for surgery.  I have reviewed the patient's chart and labs.  Questions were answered to the patient's satisfaction.     [name]  Patient seen and examined in short stay.  Clinically, much better.  Nausea vomiting resolved.  Hungry wants to eat.  Denies dysphagia.  Potassium 2.9 this AM.  Discussed with Dr. Briant Cedar; agree with need for diagnostic EGD. The risks, benefits, limitations, alternatives and imponderables have been reviewed with the patient. Potential for esophageal dilation, biopsy, etc. have also been reviewed.  Questions have been answered. All parties agreeable.   Further recommendations to follow.

## 2022-04-29 NOTE — Plan of Care (Signed)
  Problem: Education: Goal: Knowledge of General Education information will improve Description: Including pain rating scale, medication(s)/side effects and non-pharmacologic comfort measures 04/29/2022 0825 by Santa Lighter, RN Outcome: Progressing 04/28/2022 1831 by Santa Lighter, RN Outcome: Progressing   Problem: Health Behavior/Discharge Planning: Goal: Ability to manage health-related needs will improve Outcome: Progressing   Problem: Clinical Measurements: Goal: Ability to maintain clinical measurements within normal limits will improve Outcome: Progressing

## 2022-04-29 NOTE — Discharge Summary (Signed)
Physician Discharge Summary   Patient: Amy Roth MRN: [id number] DOB: 31-Mar-1934  Admit date:     04/28/2022  Discharge date: 04/29/22  Discharge Physician: Barton Dubois   PCP: Mechele Claude, FNP   Recommendations at discharge:  Repeat CBC to follow hemoglobin trend/stability Reassess blood pressure and adjust antihypertensive treatment as needed Repeat basic metabolic panel to follow electrolytes and renal function   Discharge Diagnoses: Principal Problem:   N&V (nausea and Roth) Active Problems:   Hypertension   Hypothyroidism   Depression   Hyperlipidemia   Generalized weakness   Vitamin D deficiency   Chronic hyponatremia   CKD (chronic kidney disease) stage 2, GFR 60-89 ml/min   History of stroke   Lower urinary tract infectious disease  Brief Hospital admission Course: Amy Roth is a 88 y.o. female with medical history significant of anxiety/depression, gastroesophageal flux disease, history of arthritis, hypertension, hyperlipidemia, hypothyroidism and prior history of stroke; who presented to the hospital secondary to generalized weakness, intractable nausea and Roth and coffee-ground emesis.  Patient reports symptom has been present for the last 2-3 days and after multiple episodes of Roth in the noticing son Amy Roth material.  Patient had difficulty keeping things down and experienced generalized weakness. No chest pain, no fever, no chills, no sick contacts that she is aware of; patient reports no melena or hematochezia.  Patient reports no abdominal pain, no palpitations and no hematuria. At baseline patient lives home alone with family assistance and care around-the-clock.   In the ED work-up demonstrated elevated WBCs, urinalysis suggesting UTI and a chest x-ray not demonstrating cardiopulmonary process. IV fluids, antiemetics and antibiotics provided in the ED; urine cultures taken prior to antibiotics being initiated.      Gastroenterology service consulted and [facility] hospitalist contacted to place patient in the hospital for further evaluation and management.    Assessment and Plan: * N&V (nausea and Roth) - With concerns for coffee-ground emesis after multiple Roth episodes. -Mallory-Weiss high in the differential; endoscopic evaluation demonstrating hiatal hernia and esophageal stricture. -Successful dilatation and recommendation for PPI twice a day for 1 month provided by GI. -Patient tolerated diet without problems and has been discharged home with instruction to follow-up with GI as an outpatient. -No further nausea or Roth reported. -Stable hemoglobin level appreciated.-Clear liquid diet allowed. -Avoid the use of NSAIDs and heparin products.  Lower urinary tract infectious disease - No dysuria reported at time of discharge. -Final culture results demonstrating Klebsiella pneumonia UTI, microorganism resistant to ampicillin and nitrofurantoin. -Patient responded adequately to empirical treatment with Rocephin and at discharge will continue 4 more days of cefdinir. -Advised to maintain adequate hydration. -Will benefit of outpatient follow-up with urology service; family member expressing symptoms concerning for overreactive bladder.  History of stroke -Not significant residual deficits -Safe to resume the use of aspirin for secondary prevention -Continue statin and current antihypertensive regimen.   CKD (chronic kidney disease) stage 2, GFR 60-89 ml/min -Remains stable and at baseline -Patient advised to maintain adequate hydration -Recommended repeat basic metabolic panel to follow electrolytes and renal function.  Chronic hyponatremia -Slightly worsened than her baseline in the setting of decreased oral intake and GI losses at time of admission. -Stabilized and back to her baseline after fluid resuscitation -Patient advised to maintain adequate hydration.   Vitamin D  deficiency -Continue treatment with Drisdol.  Generalized weakness -In the setting of dehydration and UTI most likely -Patient advised to continue fluid resuscitation -Complete treatment for UTI  following oral antibiotic prescriptions.   Hyperlipidemia -Continue Zocor -Heart healthy diet discussed with patient.  Depression -No suicidal ideation or hallucination -Continue the use of Paxil. -Stable mood.  Hypothyroidism -Continue Synthroid.  Hypertension -Stable overall -Continue adjusted dose of antihypertensive agents. -Heart healthy diet discussed with patient.   Consultants: GI service. Procedures performed: See below for x-ray report; EGD. Disposition: Home. Diet recommendation: Heart healthy/soft diet.  DISCHARGE MEDICATION: Allergies as of 04/29/2022       Reactions   Penicillins    Penicillins Other (See Comments)   Unknown        Medication List     TAKE these medications    amLODipine 5 MG tablet Commonly known as: NORVASC Take 0.5 tablets (2.5 mg total) by mouth daily. What changed: how much to take   aspirin 81 MG tablet Take 81 mg by mouth daily.   benazepril 5 MG tablet Commonly known as: LOTENSIN Take 5 mg by mouth daily.   cefdinir 300 MG capsule Commonly known as: OMNICEF Take 1 capsule (300 mg total) by mouth 2 (two) times daily for 4 days.   docusate sodium 100 MG capsule Commonly known as: COLACE Take 100 mg by mouth 2 (two) times daily as needed for mild constipation.   levothyroxine 75 MCG tablet Commonly known as: SYNTHROID Take 1 tablet (75 mcg total) by mouth daily before breakfast.   meloxicam 15 MG tablet Commonly known as: MOBIC Take 1 tablet (15 mg total) by mouth daily. Start taking on: May 03, 2022 What changed: These instructions start on May 03, 2022. If you are unsure what to do until then, ask your doctor or other care provider.   metoprolol succinate 25 MG 24 hr tablet Commonly known as:  TOPROL-XL Take 25 mg by mouth daily.   pantoprazole 40 MG tablet Commonly known as: PROTONIX Take 1 tablet (40 mg total) by mouth 2 (two) times daily. After one month, can take medication once a day only.   PARoxetine 30 MG tablet Commonly known as: PAXIL Take 30 mg by mouth daily.   simvastatin 20 MG tablet Commonly known as: ZOCOR Take 20 mg by mouth at bedtime.   Vitamin D (Ergocalciferol) 1.25 MG (50000 UNIT) Caps capsule Commonly known as: DRISDOL Take 1 capsule by mouth once a week.        Follow-up Information     Mechele Claude, FNP. Schedule an appointment as soon as possible for a visit in 10 day(s).   Specialty: Family Medicine Contact information: [place] [address] [phone]                Discharge Exam: Danley Danker Weights   04/28/22 1800  Weight: 64.7 kg   General exam: Alert, awake, oriented x 3; in no acute distress.  Reports no further episode of nausea or Roth. Respiratory system: Clear to auscultation. Respiratory effort normal.  Good saturation on room air. Cardiovascular system:RRR. No rubs or gallops. Gastrointestinal system: Abdomen is nondistended, soft and nontender. No organomegaly or masses felt. Normal bowel sounds heard. Central nervous system: Alert and oriented. No focal neurological deficits. Extremities: No cyanosis or clubbing. Skin: No petechiae. Psychiatry: Judgement and insight appear normal. Mood & affect appropriate.    Condition at discharge: Stable and improved.  The results of significant diagnostics from this hospitalization (including imaging, microbiology, ancillary and laboratory) are listed below for reference.   Imaging Studies: CT ABDOMEN PELVIS W CONTRAST  Result Date: 04/28/2022 CLINICAL DATA:  Generalized weakness,  nausea, Roth, and dark colored emesis EXAM: CT ABDOMEN AND PELVIS WITH CONTRAST TECHNIQUE: Multidetector CT imaging of the abdomen and pelvis was performed using the  standard protocol following bolus administration of intravenous contrast. RADIATION DOSE REDUCTION: This exam was performed according to the departmental dose-optimization program which includes automated exposure control, adjustment of the mA and/or kV according to patient size and/or use of iterative reconstruction technique. CONTRAST:  185m OMNIPAQUE IOHEXOL 300 MG/ML  SOLN COMPARISON:  Renal ultrasound dated 03/01/2021 FINDINGS: Lower chest: Bibasilar linear atelectasis/scarring. No pleural effusion or pneumothorax demonstrated. Left atrial enlargement. Coronary artery calcifications. Hepatobiliary: No focal hepatic lesions. No intra or extrahepatic biliary ductal dilation. Cholecystectomy. Pancreas: No focal lesions or main ductal dilation. Spleen: Normal in size without focal abnormality. Adrenals/Urinary Tract: No adrenal nodules. Nonobstructing 3 mm left renal calculus. No hydronephrosis. Bilateral subcentimeter hypodensities, too small to characterize. No focal bladder wall thickening. Stomach/Bowel: Normal appearance of the stomach. No evidence of bowel wall thickening, distention, or inflammatory changes. Appendectomy. Colonic diverticulosis without acute diverticulitis. Vascular/Lymphatic: Aortic atherosclerosis. No enlarged abdominal or pelvic lymph nodes. Reproductive: No adnexal masses. Other: No free fluid, fluid collection, or free air. Musculoskeletal: No acute or abnormal lytic or blastic osseous lesions. Small fat-containing paraumbilical hernia. IMPRESSION: 1. No acute abdominopelvic findings. 2. Colonic diverticulosis without acute diverticulitis. 3. Nonobstructing 3 mm left renal calculus. 4. Artery calcifications.  Aortic Atherosclerosis (ICD10-I70.0). Electronically Signed   By: LDarrin NipperM.D.   On: 04/28/2022 11:51    Microbiology: Results for orders placed or performed during the hospital encounter of 02/02/22  Resp Panel by RT-PCR (Flu A&B, Covid) Anterior Nasal Swab     Status: None    Collection Time: 02/02/22  3:41 PM   Specimen: Anterior Nasal Swab  Result Value Ref Range Status   SARS Coronavirus 2 by RT PCR NEGATIVE NEGATIVE Final    Comment: (NOTE) SARS-CoV-2 target nucleic acids are NOT DETECTED.  The SARS-CoV-2 RNA is generally detectable in upper respiratory specimens during the acute phase of infection. The lowest concentration of SARS-CoV-2 viral copies this assay can detect is 138 copies/mL. A negative result does not preclude SARS-Cov-2 infection and should not be used as the sole basis for treatment or other patient management decisions. A negative result may occur with  improper specimen collection/handling, submission of specimen other than nasopharyngeal swab, presence of viral mutation(s) within the areas targeted by this assay, and inadequate number of viral copies(<138 copies/mL). A negative result must be combined with clinical observations, patient history, and epidemiological information. The expected result is Negative.  Fact Sheet for Patients:  hEntrepreneurPulse.com.au Fact Sheet for Healthcare Providers:  hIncredibleEmployment.be This test is no t yet approved or cleared by the [place] and  has been authorized for detection and/or diagnosis of SARS-CoV-2 by FDA under an Emergency Use Authorization (EUA). This EUA will remain  in effect (meaning this test can be used) for the duration of the COVID-19 declaration under Section 564(b)(1) of the Act, 21 U.S.C.section 360bbb-3(b)(1), unless the authorization is terminated  or revoked sooner.       Influenza A by PCR NEGATIVE NEGATIVE Final   Influenza B by PCR NEGATIVE NEGATIVE Final    Comment: (NOTE) The Xpert Xpress SARS-CoV-2/FLU/RSV plus assay is intended as an aid in the diagnosis of influenza from Nasopharyngeal swab specimens and should not be used as a sole basis for treatment. Nasal washings and aspirates are unacceptable for  Xpert Xpress SARS-CoV-2/FLU/RSV testing.  Fact Sheet  for Patients: EntrepreneurPulse.com.au  Fact Sheet for Healthcare Providers: IncredibleEmployment.be  This test is not yet approved or cleared by the [place] FDA and has been authorized for detection and/or diagnosis of SARS-CoV-2 by FDA under an Emergency Use Authorization (EUA). This EUA will remain in effect (meaning this test can be used) for the duration of the COVID-19 declaration under Section 564(b)(1) of the Act, 21 U.S.C. section 360bbb-3(b)(1), unless the authorization is terminated or revoked.  Performed at [facility], [place] [address], [place], [place] [address]   Urine Culture     Status: Abnormal   Collection Time: 02/02/22 11:39 PM   Specimen: In/Out Cath Urine  Result Value Ref Range Status   Specimen Description IN/OUT CATH URINE  Final   Special Requests   Final    NONE Performed at [facility], [place] [address] [place],  [address]    Culture >=100,000 COLONIES/mL KLEBSIELLA PNEUMONIAE (A)  Final   Report Status 02/06/2022 FINAL  Final   Organism ID, Bacteria KLEBSIELLA PNEUMONIAE (A)  Final      Susceptibility   Klebsiella pneumoniae - MIC*    AMPICILLIN >=32 RESISTANT Resistant     CEFAZOLIN <=4 SENSITIVE Sensitive     CEFEPIME <=0.12 SENSITIVE Sensitive     CEFTRIAXONE <=0.25 SENSITIVE Sensitive     CIPROFLOXACIN <=0.25 SENSITIVE Sensitive     GENTAMICIN <=1 SENSITIVE Sensitive     IMIPENEM <=0.25 SENSITIVE Sensitive     NITROFURANTOIN 64 INTERMEDIATE Intermediate     TRIMETH/SULFA <=20 SENSITIVE Sensitive     AMPICILLIN/SULBACTAM 4 SENSITIVE Sensitive     PIP/TAZO <=4 SENSITIVE Sensitive     * >=100,000 COLONIES/mL KLEBSIELLA PNEUMONIAE    Labs: CBC: Recent Labs  Lab 04/28/22 0948 04/29/22 0524  WBC 19.6* 11.8*  HGB 16.2* 14.6  HCT 44.8 41.8  MCV 86.8 89.5  PLT 240 474   Basic Metabolic Panel: Recent Labs  Lab  04/28/22 0948 04/28/22 1348 04/29/22 0524  NA 127*  --  137  K 4.1  --  2.9*  CL 88*  --  103  CO2 26  --  25  GLUCOSE 163*  --  104*  BUN 8  --  10  CREATININE 0.66  --  0.87  CALCIUM 9.8  --  9.0  MG  --  2.0  --   PHOS  --  2.4*  --    Liver Function Tests: Recent Labs  Lab 04/28/22 0948  AST 50*  ALT 57*  ALKPHOS 59  BILITOT 1.1  PROT 7.8  ALBUMIN 4.2   CBG: No results for input(s): "GLUCAP" in the last 168 hours.  Discharge time spent: greater than 30 minutes.  Signed: Barton Dubois, MD [facility] 04/29/2022

## 2022-04-29 NOTE — Progress Notes (Signed)
Pt has been asleep throughout this shift and has  denied any pain. Pt has been NPO since midnight and consent has been signed and placed in the chart.

## 2022-04-29 NOTE — Progress Notes (Signed)
Discussed with Dr. Dyann Kief.  If patient is tolerating her heart healthy diet (does well through supper this evening), from a GI standpoint, she can be discharged today with recommendations as outlined in today's procedure note.

## 2022-04-29 NOTE — Anesthesia Postprocedure Evaluation (Signed)
Anesthesia Post Note  Patient: Amy Roth  Procedure(s) Performed: ESOPHAGOGASTRODUODENOSCOPY (EGD) WITH PROPOFOL  Patient location during evaluation: PACU Anesthesia Type: General Level of consciousness: awake and alert Pain management: pain level controlled Vital Signs Assessment: post-procedure vital signs reviewed and stable Respiratory status: spontaneous breathing, nonlabored ventilation, respiratory function stable and patient connected to nasal cannula oxygen Cardiovascular status: blood pressure returned to baseline and stable Postop Assessment: no apparent nausea or vomiting Anesthetic complications: no   No notable events documented.   Last Vitals:  Vitals:   04/29/22 0639 04/29/22 1029  BP: (!) 150/68 (!) 175/88  Pulse: 70 74  Resp: 19 19  Temp: 36.9 C   SpO2: 94% 97%    Last Pain:  Vitals:   04/29/22 1029  TempSrc:   PainSc: 0-No pain                 Louann Sjogren

## 2022-04-29 NOTE — Plan of Care (Signed)
  Problem: Education: Goal: Knowledge of General Education information will improve Description: Including pain rating scale, medication(s)/side effects and non-pharmacologic comfort measures 04/29/2022 1646 by Santa Lighter, RN Outcome: Adequate for Discharge 04/29/2022 0825 by Santa Lighter, RN Outcome: Progressing   Problem: Health Behavior/Discharge Planning: Goal: Ability to manage health-related needs will improve Outcome: Adequate for Discharge   Problem: Clinical Measurements: Goal: Ability to maintain clinical measurements within normal limits will improve 04/29/2022 1646 by Santa Lighter, RN Outcome: Adequate for Discharge 04/29/2022 0825 by Santa Lighter, RN Outcome: Progressing Goal: Will remain free from infection Outcome: Adequate for Discharge Goal: Diagnostic test results will improve Outcome: Adequate for Discharge Goal: Respiratory complications will improve Outcome: Adequate for Discharge Goal: Cardiovascular complication will be avoided Outcome: Adequate for Discharge   Problem: Activity: Goal: Risk for activity intolerance will decrease Outcome: Adequate for Discharge   Problem: Nutrition: Goal: Adequate nutrition will be maintained Outcome: Adequate for Discharge   Problem: Coping: Goal: Level of anxiety will decrease Outcome: Adequate for Discharge   Problem: Elimination: Goal: Will not experience complications related to bowel motility Outcome: Adequate for Discharge Goal: Will not experience complications related to urinary retention Outcome: Adequate for Discharge   Problem: Pain Managment: Goal: General experience of comfort will improve Outcome: Adequate for Discharge   Problem: Safety: Goal: Ability to remain free from injury will improve Outcome: Adequate for Discharge   Problem: Skin Integrity: Goal: Risk for impaired skin integrity will decrease Outcome: Adequate for Discharge

## 2022-04-30 DIAGNOSIS — K920 Hematemesis: Secondary | ICD-10-CM

## 2022-05-01 LAB — URINE CULTURE: Culture: >=100000 — AB

## 2022-05-02 DIAGNOSIS — E782 Mixed hyperlipidemia: Secondary | ICD-10-CM | POA: Diagnosis not present

## 2022-05-02 DIAGNOSIS — E039 Hypothyroidism, unspecified: Secondary | ICD-10-CM | POA: Diagnosis not present

## 2022-05-02 DIAGNOSIS — I10 Essential (primary) hypertension: Secondary | ICD-10-CM | POA: Diagnosis not present

## 2022-05-11 DIAGNOSIS — L820 Inflamed seborrheic keratosis: Secondary | ICD-10-CM | POA: Diagnosis not present

## 2022-05-15 ENCOUNTER — Encounter (HOSPITAL_COMMUNITY): Payer: Self-pay | Admitting: Internal Medicine

## 2022-05-15 DIAGNOSIS — H168 Other keratitis: Secondary | ICD-10-CM | POA: Diagnosis not present

## 2022-05-16 DIAGNOSIS — R531 Weakness: Secondary | ICD-10-CM | POA: Diagnosis not present

## 2022-05-16 DIAGNOSIS — I959 Hypotension, unspecified: Secondary | ICD-10-CM | POA: Diagnosis not present

## 2022-05-17 DIAGNOSIS — E559 Vitamin D deficiency, unspecified: Secondary | ICD-10-CM | POA: Diagnosis not present

## 2022-05-17 DIAGNOSIS — I10 Essential (primary) hypertension: Secondary | ICD-10-CM | POA: Diagnosis not present

## 2022-05-17 DIAGNOSIS — E039 Hypothyroidism, unspecified: Secondary | ICD-10-CM | POA: Diagnosis not present

## 2022-05-17 DIAGNOSIS — N390 Urinary tract infection, site not specified: Secondary | ICD-10-CM | POA: Diagnosis not present

## 2022-05-17 DIAGNOSIS — R410 Disorientation, unspecified: Secondary | ICD-10-CM | POA: Diagnosis not present

## 2022-05-17 DIAGNOSIS — E782 Mixed hyperlipidemia: Secondary | ICD-10-CM | POA: Diagnosis not present

## 2022-05-17 DIAGNOSIS — R300 Dysuria: Secondary | ICD-10-CM | POA: Diagnosis not present

## 2022-05-19 DIAGNOSIS — E782 Mixed hyperlipidemia: Secondary | ICD-10-CM | POA: Diagnosis not present

## 2022-05-19 DIAGNOSIS — I10 Essential (primary) hypertension: Secondary | ICD-10-CM | POA: Diagnosis not present

## 2022-05-19 DIAGNOSIS — N390 Urinary tract infection, site not specified: Secondary | ICD-10-CM | POA: Diagnosis not present

## 2022-05-19 DIAGNOSIS — E039 Hypothyroidism, unspecified: Secondary | ICD-10-CM | POA: Diagnosis not present

## 2022-05-19 DIAGNOSIS — F32A Depression, unspecified: Secondary | ICD-10-CM | POA: Diagnosis not present

## 2022-05-20 IMAGING — US US RENAL
1 series · 14 of 25 positions shown · non-contrast
Comparison: None.

CLINICAL DATA: Stage 3a chronic kidney disease (HCC)

EXAM:
RENAL / URINARY TRACT ULTRASOUND COMPLETE

[Series 1: us renal · 0.21mm/px · 14 of 35 slices shown]
[im 1/35]
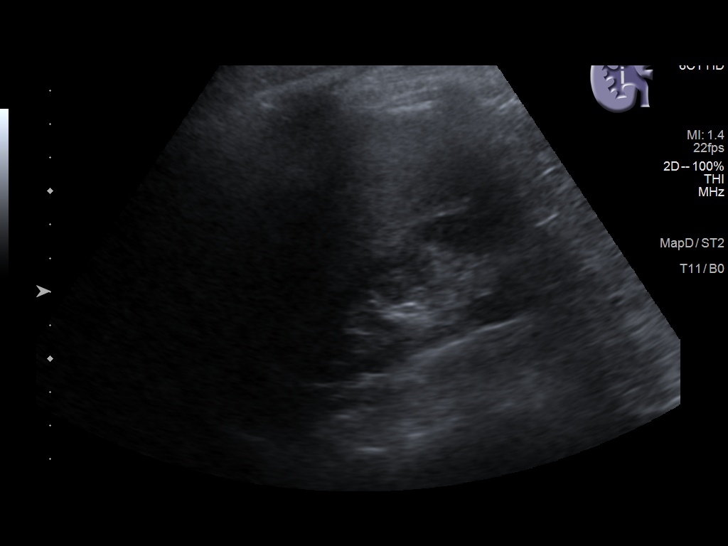
[im 3/35]
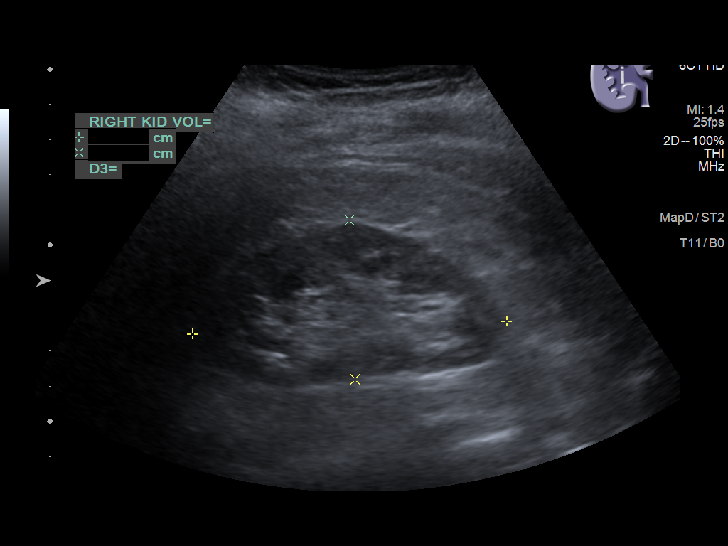
[im 6/35]
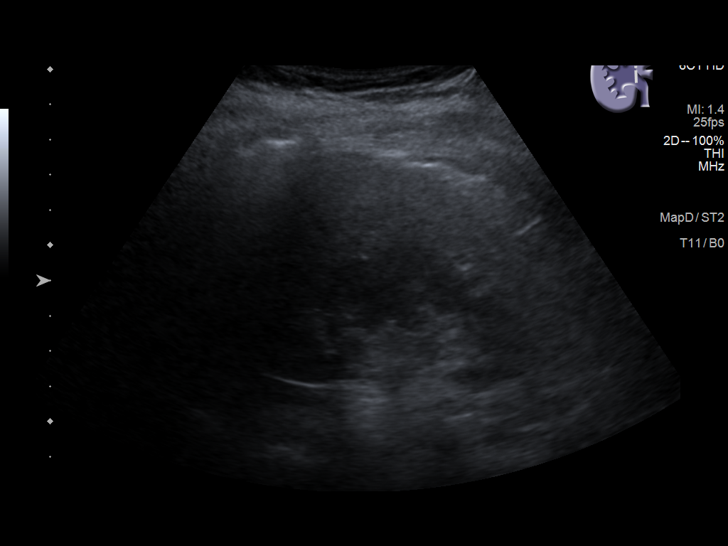
[im 9/35]
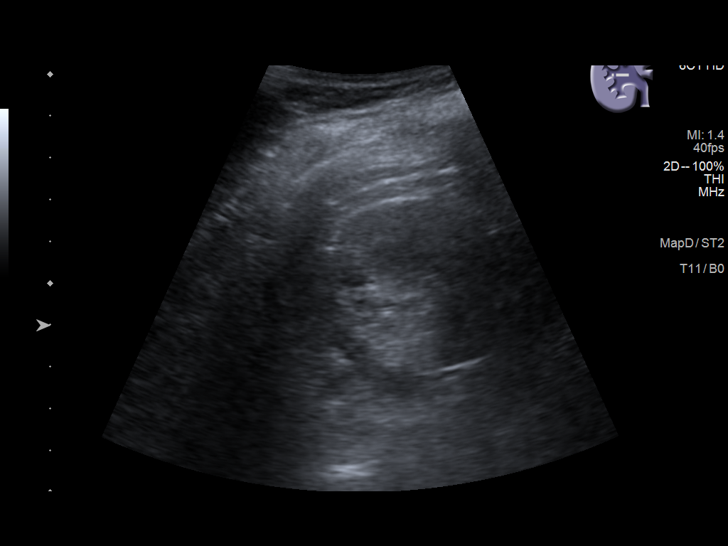
[im 12/35]
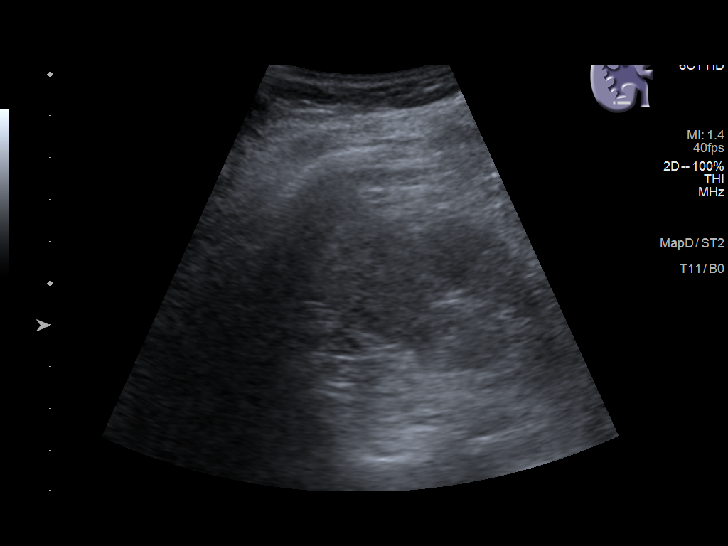
[im 13/35]
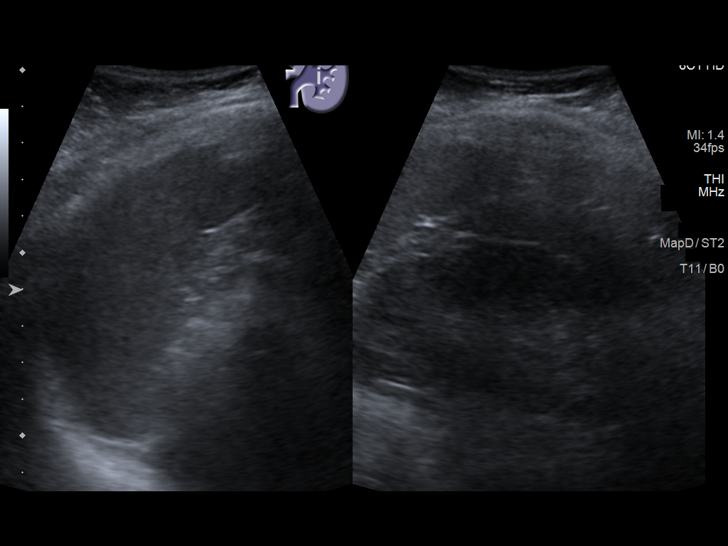
[im 16/35]
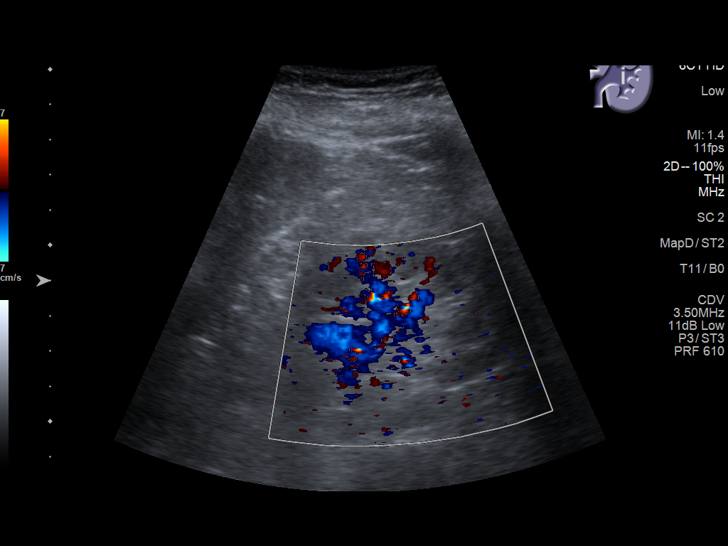
[im 19/35]
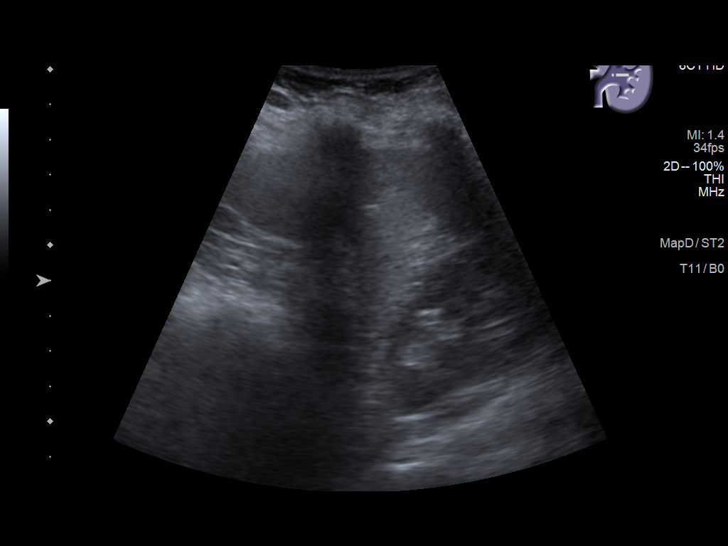
[im 22/35]
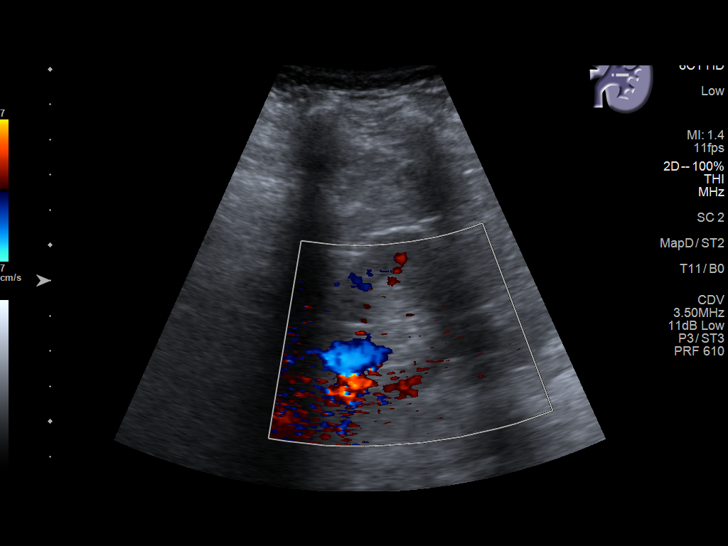
[im 23/35]
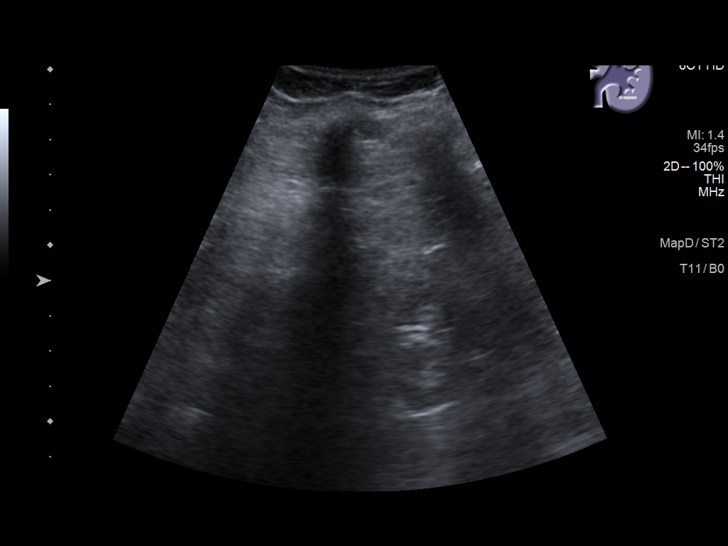
[im 26/35]
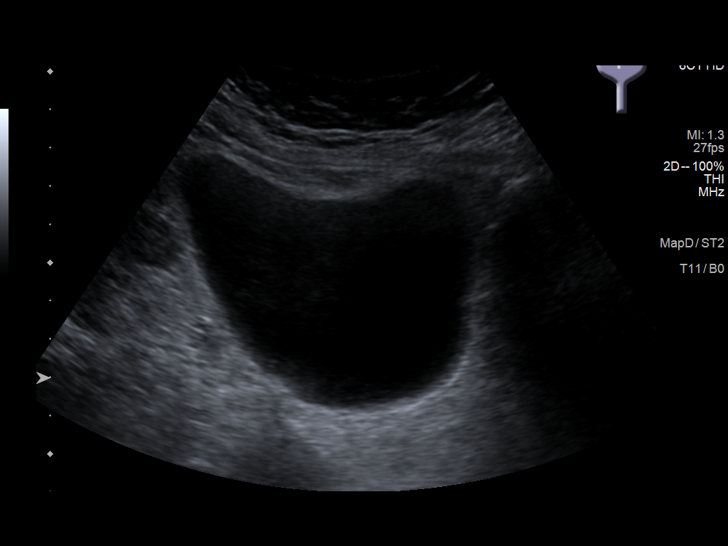
[im 29/35]
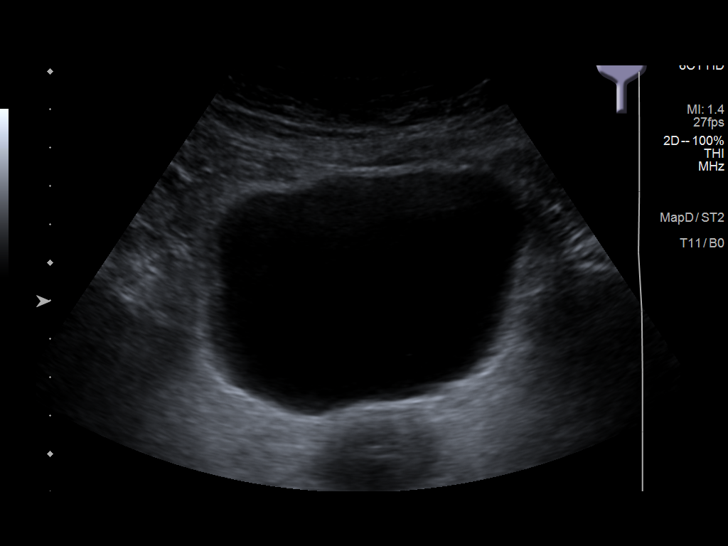
[im 32/35]
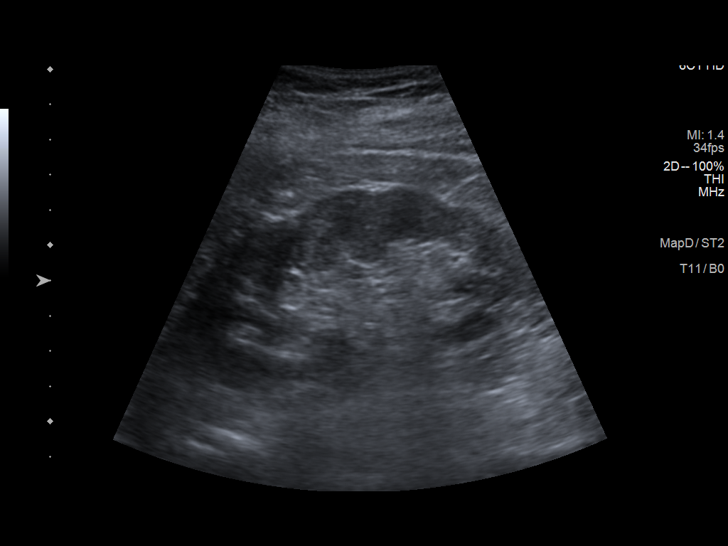
[im 35/35]
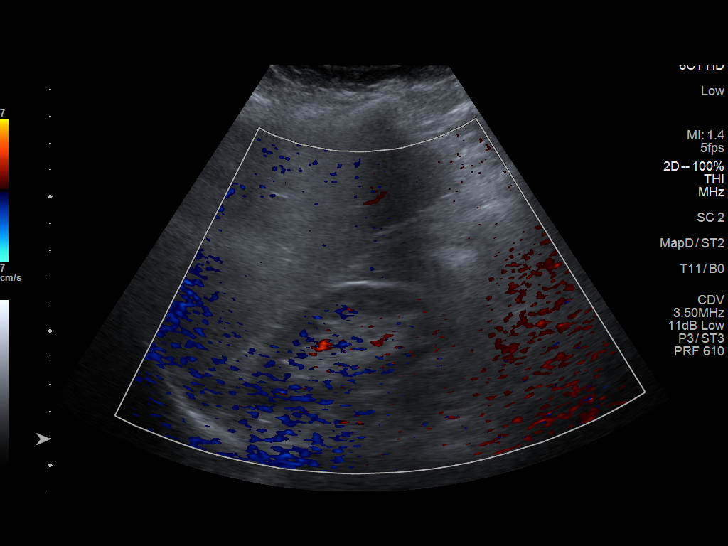

[14 of 25 positions shown; findings below may reference images not displayed]

FINDINGS: Right Kidney:

Renal measurements: 8.9 x 4.5 x 3.5 cm = volume: 73 mL. Echogenicity
within normal limits. No mass or hydronephrosis visualized.

Left Kidney:

Renal measurements: 9.2 x 4.6 x 3.9 cm = volume: 88 mL. Echogenicity
within normal limits. No mass or hydronephrosis visualized.

Bladder:

Appears normal for degree of bladder distention.

Other:

Diffusely increased hepatic echogenicity.
IMPRESSION: 1. No hydronephrosis.
2. Increased hepatic echogenicity as can be seen in hepatic
steatosis.

## 2022-07-13 DIAGNOSIS — R5381 Other malaise: Secondary | ICD-10-CM | POA: Diagnosis not present

## 2022-07-13 DIAGNOSIS — R5383 Other fatigue: Secondary | ICD-10-CM | POA: Diagnosis not present

## 2022-07-20 DIAGNOSIS — N390 Urinary tract infection, site not specified: Secondary | ICD-10-CM | POA: Diagnosis not present

## 2022-07-20 DIAGNOSIS — R300 Dysuria: Secondary | ICD-10-CM | POA: Diagnosis not present

## 2022-08-01 DIAGNOSIS — I10 Essential (primary) hypertension: Secondary | ICD-10-CM

## 2022-08-01 DIAGNOSIS — E039 Hypothyroidism, unspecified: Secondary | ICD-10-CM

## 2022-08-01 DIAGNOSIS — E782 Mixed hyperlipidemia: Secondary | ICD-10-CM

## 2022-08-17 DIAGNOSIS — H353211 Exudative age-related macular degeneration, right eye, with active choroidal neovascularization: Secondary | ICD-10-CM | POA: Diagnosis not present

## 2022-08-17 DIAGNOSIS — H168 Other keratitis: Secondary | ICD-10-CM | POA: Diagnosis not present

## 2022-08-17 DIAGNOSIS — H02053 Trichiasis without entropian right eye, unspecified eyelid: Secondary | ICD-10-CM | POA: Diagnosis not present

## 2022-08-25 ENCOUNTER — Encounter: Payer: Self-pay | Admitting: Family

## 2022-08-25 ENCOUNTER — Ambulatory Visit (INDEPENDENT_AMBULATORY_CARE_PROVIDER_SITE_OTHER): Payer: Medicare Other | Admitting: Family

## 2022-08-25 VITALS — BP 128/72 | HR 81 | Ht 61.0 in

## 2022-08-25 DIAGNOSIS — N390 Urinary tract infection, site not specified: Secondary | ICD-10-CM

## 2022-08-25 DIAGNOSIS — I10 Essential (primary) hypertension: Secondary | ICD-10-CM

## 2022-08-25 DIAGNOSIS — E559 Vitamin D deficiency, unspecified: Secondary | ICD-10-CM | POA: Diagnosis not present

## 2022-08-25 DIAGNOSIS — E782 Mixed hyperlipidemia: Secondary | ICD-10-CM

## 2022-08-25 DIAGNOSIS — R7303 Prediabetes: Secondary | ICD-10-CM | POA: Diagnosis not present

## 2022-08-25 DIAGNOSIS — F03B11 Unspecified dementia, moderate, with agitation: Secondary | ICD-10-CM

## 2022-08-25 DIAGNOSIS — E039 Hypothyroidism, unspecified: Secondary | ICD-10-CM | POA: Diagnosis not present

## 2022-08-25 DIAGNOSIS — N1831 Chronic kidney disease, stage 3a: Secondary | ICD-10-CM | POA: Diagnosis not present

## 2022-08-25 DIAGNOSIS — E538 Deficiency of other specified B group vitamins: Secondary | ICD-10-CM | POA: Diagnosis not present

## 2022-08-25 DIAGNOSIS — F411 Generalized anxiety disorder: Secondary | ICD-10-CM

## 2022-08-25 LAB — POCT URINALYSIS DIPSTICK
Bilirubin, UA: NEGATIVE
Blood, UA: NEGATIVE
Glucose, UA: NEGATIVE
Ketones, UA: NEGATIVE
Nitrite, UA: NEGATIVE
Protein, UA: NEGATIVE
Spec Grav, UA: 1.01 (ref 1.010–1.025)
Urobilinogen, UA: 0.2 E.U./dL
pH, UA: 6 (ref 5.0–8.0)

## 2022-08-25 MED ORDER — FOSFOMYCIN TROMETHAMINE 3 G PO PACK: 3.0000 g | PACK | Freq: Every day | ORAL | 0 refills | Status: AC

## 2022-08-25 NOTE — Progress Notes (Unsigned)
Established Patient Office Visit  Subjective:  Patient ID: Amy Roth, female    DOB: Sep 29, 1933  Age: 89 y.o. MRN: [id number]  Chief Complaint  Patient presents with   Follow-up    3 month follow up    Patient is here today with her daughter and son for her 3 month f/u.  As usual, she is disoriented, but it does seem to be more severe than usual today.  Her daughter reports that she did not sleep last night, and she is clearly agitated and has said she needs to use the bathroom multiple times during her visit today.   They are concerned that she might have a UTI, as they have said that the agitation that she is experiencing today and the impatience is typical of when she gets one. They say that she has been "obsessed" with going to the bathroom, that she goes to the bathroom at least 15 times a day, but sometimes will not actually use it because she is going so often.    Dementia: She is here for evaluation and treatment of cognitive problems. She is accompanied by her son and daughter. Primary caregiver is son, daughter, and nursing attendant. The family and the patient identify problems with changes in short and long term memory, day/night changes, insomnia, getting disoriented outside of familiar environment, and agitation. Family and patient report problems with agitation. Family and patient are concerned about  hydration, however, they are not concerned about wandering, driving, and cooking. Medication administration: family monitors medication usage   Functional Assessment:  Activities of Daily Living (ADLs):   She is independent in the following: ambulation, feeding, and grooming Requires assistance with the following: continence, toileting, and dressing  She is due for labs today. No other concerns at this time.      Past Medical History:  Diagnosis Date   AKI (acute kidney injury) ([place])    Anxiety    Arthritis    Arthritis    Coffee ground emesis     Complication of anesthesia    hard to wake up   Depression    DVT (deep venous thrombosis) ([facility])    Dysrhythmia    Edema    feet/legs   GERD (gastroesophageal reflux disease)    Heart murmur    History of stroke 04/28/2022   Hyperlipidemia    Hypertension    Hyponatremia    Hypothyroidism    N&V (nausea and vomiting) 04/28/2022   Palpitation    Stroke ([place])    facial    Social History   Socioeconomic History   Marital status: Divorced    Spouse name: Not on file   Number of children: Not on file   Years of education: Not on file   Highest education level: Not on file  Occupational History   Not on file  Tobacco Use   Smoking status: Never   Smokeless tobacco: Never  Vaping Use   Vaping Use: Never used  Substance and Sexual Activity   Alcohol use: Never   Drug use: Never   Sexual activity: Yes    Birth control/protection: None  Other Topics Concern   Not on file  Social History Narrative   ** Merged History Encounter **       Social Determinants of Health   Financial Resource Strain: Not on file  Food Insecurity: Not on file  Transportation Needs: Not on file  Physical Activity: Not on file  Stress: Not on file  Social Connections:  Not on file  Intimate Partner Violence: Not on file    Family History  Problem Relation Age of Onset   Hypertension Father    Heart disease Father    Hypertension Mother    Heart disease Mother    Breast cancer Neg Hx     Allergies  Allergen Reactions   Nitrofurantoin Macrocrystal    Penicillins    Penicillins Other (See Comments)    Unknown    Review of Systems  All other systems reviewed and are negative.      Objective:   BP 128/72   Pulse 81   Ht '5\' 1"'$  (1.549 m)   SpO2 96%   BMI 26.95 kg/m   Vitals:   08/25/22 0922  BP: 128/72  Pulse: 81  Height: '5\' 1"'$  (1.549 m)  SpO2: 96%    Physical Exam   Results for orders placed or performed in visit on 08/25/22  Microscopic Examination  Result  Value Ref Range   WBC, UA 0-5 0 - 5 /hpf   RBC, Urine None seen 0 - 2 /hpf   Epithelial Cells (non renal) 0-10 0 - 10 /hpf   Casts None seen None seen /lpf   Bacteria, UA None seen None seen/Few  Lipid panel  Result Value Ref Range   Cholesterol, Total 167 100 - 199 mg/dL   Triglycerides 106 0 - 149 mg/dL   HDL 54 >39 mg/dL   VLDL Cholesterol Cal 19 5 - 40 mg/dL   LDL Chol Calc (NIH) 94 0 - 99 mg/dL   Chol/HDL Ratio 3.1 0.0 - 4.4 ratio  VITAMIN D 25 Hydroxy (Vit-D Deficiency, Fractures)  Result Value Ref Range   Vit D, 25-Hydroxy 52.5 30.0 - 100.0 ng/mL  CBC With Differential  Result Value Ref Range   WBC 7.6 3.4 - 10.8 x10E3/uL   RBC 4.88 3.77 - 5.28 x10E6/uL   Hemoglobin 15.5 11.1 - 15.9 g/dL   Hematocrit 44.8 34.0 - 46.6 %   MCV 92 79 - 97 fL   MCH 31.8 26.6 - 33.0 pg   MCHC 34.6 31.5 - 35.7 g/dL   RDW 13.2 11.7 - 15.4 %   Neutrophils 68 Not Estab. %   Lymphs 22 Not Estab. %   Monocytes 6 Not Estab. %   Eos 3 Not Estab. %   Basos 1 Not Estab. %   Neutrophils Absolute 5.1 1.4 - 7.0 x10E3/uL   Lymphocytes Absolute 1.7 0.7 - 3.1 x10E3/uL   Monocytes Absolute 0.5 0.1 - 0.9 x10E3/uL   EOS (ABSOLUTE) 0.2 0.0 - 0.4 x10E3/uL   Basophils Absolute 0.1 0.0 - 0.2 x10E3/uL   Immature Granulocytes 0 Not Estab. %   Immature Grans (Abs) 0.0 0.0 - 0.1 x10E3/uL  CMP14+EGFR  Result Value Ref Range   Glucose 127 (H) 70 - 99 mg/dL   BUN 10 8 - 27 mg/dL   Creatinine, Ser 0.87 0.57 - 1.00 mg/dL   eGFR 65 >59 mL/min/1.73   BUN/Creatinine Ratio 11 (L) 12 - 28   Sodium 140 134 - 144 mmol/L   Potassium 3.8 3.5 - 5.2 mmol/L   Chloride 98 96 - 106 mmol/L   CO2 22 20 - 29 mmol/L   Calcium 9.9 8.7 - 10.3 mg/dL   Total Protein 7.0 6.0 - 8.5 g/dL   Albumin 4.5 3.7 - 4.7 g/dL   Globulin, Total 2.5 1.5 - 4.5 g/dL   Albumin/Globulin Ratio 1.8 1.2 - 2.2   Bilirubin Total 0.6 0.0 - 1.2 mg/dL  Alkaline Phosphatase 69 44 - 121 IU/L   AST 56 (H) 0 - 40 IU/L   ALT 66 (H) 0 - 32 IU/L  TSH   Result Value Ref Range   TSH 0.873 0.450 - 4.500 uIU/mL  Hemoglobin A1c  Result Value Ref Range   Hgb A1c MFr Bld 5.8 (H) 4.8 - 5.6 %   Est. average glucose Bld gHb Est-mCnc 120 mg/dL  Vitamin B12  Result Value Ref Range   Vitamin B-12 378 232 - 1,245 pg/mL  Urinalysis, Routine w reflex microscopic  Result Value Ref Range   Specific Gravity, UA 1.008 1.005 - 1.030   pH, UA 7.0 5.0 - 7.5   Color, UA Yellow Yellow   Appearance Ur Clear Clear   Leukocytes,UA 1+ (A) Negative   Protein,UA Negative Negative/Trace   Glucose, UA Negative Negative   Ketones, UA Negative Negative   RBC, UA Negative Negative   Bilirubin, UA Negative Negative   Urobilinogen, Ur 0.2 0.2 - 1.0 mg/dL   Nitrite, UA Negative Negative   Microscopic Examination See below:   POCT Urinalysis Dipstick [id number])  Result Value Ref Range   Color, UA     Clarity, UA Cloudy    Glucose, UA Negative Negative   Bilirubin, UA Negative    Ketones, UA Negative    Spec Grav, UA 1.010 1.010 - 1.025   Blood, UA Negative    pH, UA 6.0 5.0 - 8.0   Protein, UA Negative Negative   Urobilinogen, UA 0.2 0.2 or 1.0 E.U./dL   Nitrite, UA Negative    Leukocytes, UA Trace (A) Negative   Appearance     Odor      Recent Results (from the past 2160 hour(s))  POCT Urinalysis Dipstick [id number])     Status: Abnormal   Collection Time: 08/25/22  9:26 AM  Result Value Ref Range   Color, UA     Clarity, UA Cloudy    Glucose, UA Negative Negative   Bilirubin, UA Negative    Ketones, UA Negative    Spec Grav, UA 1.010 1.010 - 1.025   Blood, UA Negative    pH, UA 6.0 5.0 - 8.0   Protein, UA Negative Negative   Urobilinogen, UA 0.2 0.2 or 1.0 E.U./dL   Nitrite, UA Negative    Leukocytes, UA Trace (A) Negative   Appearance     Odor    Lipid panel     Status: None   Collection Time: 08/25/22 10:54 AM  Result Value Ref Range   Cholesterol, Total 167 100 - 199 mg/dL   Triglycerides 106 0 - 149 mg/dL   HDL 54 >39 mg/dL   VLDL  Cholesterol Cal 19 5 - 40 mg/dL   LDL Chol Calc (NIH) 94 0 - 99 mg/dL   Chol/HDL Ratio 3.1 0.0 - 4.4 ratio    Comment:                                   T. Chol/HDL Ratio                                             Men  Women  1/2 Avg.Risk  3.4    3.3                                   Avg.Risk  5.0    4.4                                2X Avg.Risk  9.6    7.1                                3X Avg.Risk 23.4   11.0   VITAMIN D 25 Hydroxy (Vit-D Deficiency, Fractures)     Status: None   Collection Time: 08/25/22 10:54 AM  Result Value Ref Range   Vit D, 25-Hydroxy 52.5 30.0 - 100.0 ng/mL    Comment: Vitamin D deficiency has been defined by the [place] practice guideline as a level of serum 25-OH vitamin D less than 20 ng/mL (1,2). The Endocrine Society went on to further define vitamin D insufficiency as a level between 21 and 29 ng/mL (2). 1. IOM (Institute of Medicine). 2010. Dietary reference    intakes for calcium and D. [place]: The    [employer]. 2. Holick MF, Binkley [place], Bischoff-Ferrari HA, et al.    Evaluation, treatment, and prevention of vitamin D    deficiency: an Endocrine Society clinical practice    guideline. JCEM. 2011 Jul; 96(7):1911-30.   CBC With Differential     Status: None   Collection Time: 08/25/22 10:54 AM  Result Value Ref Range   WBC 7.6 3.4 - 10.8 x10E3/uL   RBC 4.88 3.77 - 5.28 x10E6/uL   Hemoglobin 15.5 11.1 - 15.9 g/dL   Hematocrit 44.8 34.0 - 46.6 %   MCV 92 79 - 97 fL   MCH 31.8 26.6 - 33.0 pg   MCHC 34.6 31.5 - 35.7 g/dL   RDW 13.2 11.7 - 15.4 %   Neutrophils 68 Not Estab. %   Lymphs 22 Not Estab. %   Monocytes 6 Not Estab. %   Eos 3 Not Estab. %   Basos 1 Not Estab. %   Neutrophils Absolute 5.1 1.4 - 7.0 x10E3/uL   Lymphocytes Absolute 1.7 0.7 - 3.1 x10E3/uL   Monocytes Absolute 0.5 0.1 - 0.9 x10E3/uL   EOS (ABSOLUTE) 0.2 0.0 - 0.4 x10E3/uL    Basophils Absolute 0.1 0.0 - 0.2 x10E3/uL   Immature Granulocytes 0 Not Estab. %   Immature Grans (Abs) 0.0 0.0 - 0.1 x10E3/uL  CMP14+EGFR     Status: Abnormal   Collection Time: 08/25/22 10:54 AM  Result Value Ref Range   Glucose 127 (H) 70 - 99 mg/dL   BUN 10 8 - 27 mg/dL   Creatinine, Ser 0.87 0.57 - 1.00 mg/dL   eGFR 65 >59 mL/min/1.73   BUN/Creatinine Ratio 11 (L) 12 - 28   Sodium 140 134 - 144 mmol/L   Potassium 3.8 3.5 - 5.2 mmol/L   Chloride 98 96 - 106 mmol/L   CO2 22 20 - 29 mmol/L   Calcium 9.9 8.7 - 10.3 mg/dL   Total Protein 7.0 6.0 - 8.5 g/dL   Albumin 4.5 3.7 - 4.7 g/dL   Globulin, Total 2.5 1.5 - 4.5 g/dL   Albumin/Globulin Ratio 1.8 1.2 - 2.2  Bilirubin Total 0.6 0.0 - 1.2 mg/dL   Alkaline Phosphatase 69 44 - 121 IU/L   AST 56 (H) 0 - 40 IU/L   ALT 66 (H) 0 - 32 IU/L  TSH     Status: None   Collection Time: 08/25/22 10:54 AM  Result Value Ref Range   TSH 0.873 0.450 - 4.500 uIU/mL  Hemoglobin A1c     Status: Abnormal   Collection Time: 08/25/22 10:54 AM  Result Value Ref Range   Hgb A1c MFr Bld 5.8 (H) 4.8 - 5.6 %    Comment:          Prediabetes: 5.7 - 6.4          Diabetes: >6.4          Glycemic control for adults with diabetes: <7.0    Est. average glucose Bld gHb Est-mCnc 120 mg/dL  Vitamin B12     Status: None   Collection Time: 08/25/22 10:54 AM  Result Value Ref Range   Vitamin B-12 378 232 - 1,245 pg/mL  Urinalysis, Routine w reflex microscopic     Status: Abnormal   Collection Time: 08/25/22 10:54 AM  Result Value Ref Range   Specific Gravity, UA 1.008 1.005 - 1.030   pH, UA 7.0 5.0 - 7.5   Color, UA Yellow Yellow   Appearance Ur Clear Clear   Leukocytes,UA 1+ (A) Negative   Protein,UA Negative Negative/Trace   Glucose, UA Negative Negative   Ketones, UA Negative Negative   RBC, UA Negative Negative   Bilirubin, UA Negative Negative   Urobilinogen, Ur 0.2 0.2 - 1.0 mg/dL   Nitrite, UA Negative Negative   Microscopic Examination See  below:     Comment: Microscopic was indicated and was performed.  Microscopic Examination     Status: None   Collection Time: 08/25/22 10:54 AM  Result Value Ref Range   WBC, UA 0-5 0 - 5 /hpf   RBC, Urine None seen 0 - 2 /hpf   Epithelial Cells (non renal) 0-10 0 - 10 /hpf   Casts None seen None seen /lpf   Bacteria, UA None seen None seen/Few      Assessment & Plan:   Problem List Items Addressed This Visit     Hypothyroidism    Checking labs today.  Continue current dosing - will change dose if we need to.       Relevant Orders   TSH (Completed)   Vitamin D deficiency   Relevant Orders   VITAMIN D 25 Hydroxy (Vit-D Deficiency, Fractures) (Completed)   Chronic kidney disease, stage 3a ([facility])   Generalized anxiety disorder   Lower urinary tract infectious disease - Primary    Continue prophylaxis.  UA in office today abnormal - sending for UA/UC.  Sending Monurol for pt as well as this has helped immediately in the past.        Relevant Medications   fosfomycin (MONUROL) 3 g PACK   cephALEXin (KEFLEX) 250 MG capsule   Other Relevant Orders   POCT Urinalysis Dipstick (81002) (Completed)   Urinalysis, Routine w reflex microscopic (Completed)   Urine Culture   Dementia ([place])    Seems agitated more than usual today.  Will give patient additional sample of Rexulti to try.         Other Visit Diagnoses     Stage 3a chronic kidney disease ([place])   (Chronic)     Relevant Orders   CBC With Differential (Completed)   CMP14+EGFR (Completed)  B12 deficiency due to diet       Relevant Orders   Vitamin B12 (Completed)   Mixed hyperlipidemia       Relevant Orders   Lipid panel (Completed)   Essential hypertension, benign       Prediabetes       Relevant Orders   Hemoglobin A1c (Completed)       Return in about 3 months (around 11/23/2022).   Total time spent: 30 minutes  Mechele Claude, FNP  08/25/2022

## 2022-08-26 ENCOUNTER — Encounter: Payer: Self-pay | Admitting: Family

## 2022-08-26 DIAGNOSIS — F0390 Unspecified dementia without behavioral disturbance: Secondary | ICD-10-CM | POA: Insufficient documentation

## 2022-08-26 LAB — URINALYSIS, ROUTINE W REFLEX MICROSCOPIC
Bilirubin, UA: NEGATIVE
Glucose, UA: NEGATIVE
Ketones, UA: NEGATIVE
Nitrite, UA: NEGATIVE
Protein,UA: NEGATIVE
RBC, UA: NEGATIVE
Specific Gravity, UA: 1.008 (ref 1.005–1.030)
Urobilinogen, Ur: 0.2 mg/dL (ref 0.2–1.0)
pH, UA: 7 (ref 5.0–7.5)

## 2022-08-26 LAB — CMP14+EGFR
ALT: 66 IU/L — ABNORMAL HIGH (ref 0–32)
AST: 56 IU/L — ABNORMAL HIGH (ref 0–40)
Albumin/Globulin Ratio: 1.8 (ref 1.2–2.2)
Albumin: 4.5 g/dL (ref 3.7–4.7)
Alkaline Phosphatase: 69 IU/L (ref 44–121)
BUN/Creatinine Ratio: 11 — ABNORMAL LOW (ref 12–28)
BUN: 10 mg/dL (ref 8–27)
Bilirubin Total: 0.6 mg/dL (ref 0.0–1.2)
CO2: 22 mmol/L (ref 20–29)
Calcium: 9.9 mg/dL (ref 8.7–10.3)
Chloride: 98 mmol/L (ref 96–106)
Creatinine, Ser: 0.87 mg/dL (ref 0.57–1.00)
Globulin, Total: 2.5 g/dL (ref 1.5–4.5)
Glucose: 127 mg/dL — ABNORMAL HIGH (ref 70–99)
Potassium: 3.8 mmol/L (ref 3.5–5.2)
Sodium: 140 mmol/L (ref 134–144)
Total Protein: 7 g/dL (ref 6.0–8.5)
eGFR: 65 mL/min/{1.73_m2} (ref >59)

## 2022-08-26 LAB — MICROSCOPIC EXAMINATION
Bacteria, UA: NONE SEEN
Casts: NONE SEEN /lpf
RBC, Urine: NONE SEEN /hpf (ref 0–2)

## 2022-08-26 LAB — CBC WITH DIFFERENTIAL
Basophils Absolute: 0.1 10*3/uL (ref 0.0–0.2)
Basos: 1 %
EOS (ABSOLUTE): 0.2 10*3/uL (ref 0.0–0.4)
Eos: 3 %
Hematocrit: 44.8 % (ref 34.0–46.6)
Hemoglobin: 15.5 g/dL (ref 11.1–15.9)
Immature Grans (Abs): 0 10*3/uL (ref 0.0–0.1)
Immature Granulocytes: 0 %
Lymphocytes Absolute: 1.7 10*3/uL (ref 0.7–3.1)
Lymphs: 22 %
MCH: 31.8 pg (ref 26.6–33.0)
MCHC: 34.6 g/dL (ref 31.5–35.7)
MCV: 92 fL (ref 79–97)
Monocytes Absolute: 0.5 10*3/uL (ref 0.1–0.9)
Monocytes: 6 %
Neutrophils Absolute: 5.1 10*3/uL (ref 1.4–7.0)
Neutrophils: 68 %
RBC: 4.88 x10E6/uL (ref 3.77–5.28)
RDW: 13.2 % (ref 11.7–15.4)
WBC: 7.6 10*3/uL (ref 3.4–10.8)

## 2022-08-26 LAB — HEMOGLOBIN A1C
Est. average glucose Bld gHb Est-mCnc: 120 mg/dL
Hgb A1c MFr Bld: 5.8 % — ABNORMAL HIGH (ref 4.8–5.6)

## 2022-08-26 LAB — VITAMIN B12: Vitamin B-12: 378 pg/mL (ref 232–1245)

## 2022-08-26 LAB — LIPID PANEL
Chol/HDL Ratio: 3.1 ratio (ref 0.0–4.4)
Cholesterol, Total: 167 mg/dL (ref 100–199)
HDL: 54 mg/dL (ref >39)
LDL Chol Calc (NIH): 94 mg/dL (ref 0–99)
Triglycerides: 106 mg/dL (ref 0–149)
VLDL Cholesterol Cal: 19 mg/dL (ref 5–40)

## 2022-08-26 LAB — VITAMIN D 25 HYDROXY (VIT D DEFICIENCY, FRACTURES): Vit D, 25-Hydroxy: 52.5 ng/mL (ref 30.0–100.0)

## 2022-08-26 LAB — TSH: TSH: 0.873 u[IU]/mL (ref 0.450–4.500)

## 2022-08-26 MED ORDER — CEPHALEXIN 250 MG PO CAPS: 250.0000 mg | ORAL_CAPSULE | Freq: Every day | ORAL | 1 refills | Status: DC

## 2022-08-26 NOTE — Assessment & Plan Note (Signed)
Seems agitated more than usual today.  Will give patient additional sample of Rexulti to try.

## 2022-08-26 NOTE — Assessment & Plan Note (Signed)
Checking labs today.  Continue current dosing - will change dose if we need to.

## 2022-08-26 NOTE — Assessment & Plan Note (Signed)
Continue prophylaxis.  UA in office today abnormal - sending for UA/UC.  Sending Monurol for pt as well as this has helped immediately in the past.

## 2022-08-28 LAB — URINE CULTURE

## 2022-09-19 ENCOUNTER — Other Ambulatory Visit: Payer: Self-pay | Admitting: Family

## 2022-09-19 DIAGNOSIS — N3281 Overactive bladder: Secondary | ICD-10-CM | POA: Insufficient documentation

## 2022-09-19 MED ORDER — ROPINIROLE HCL 0.25 MG PO TABS: 0.2500 mg | ORAL_TABLET | Freq: Every day | ORAL | 0 refills | Status: DC

## 2022-09-19 MED ORDER — GEMTESA 75 MG PO TABS: 75.0000 mg | ORAL_TABLET | Freq: Every day | ORAL | 5 refills | Status: DC

## 2022-10-04 ENCOUNTER — Emergency Department (HOSPITAL_COMMUNITY): Payer: Medicare Other

## 2022-10-04 ENCOUNTER — Encounter (HOSPITAL_COMMUNITY): Payer: Self-pay | Admitting: Emergency Medicine

## 2022-10-04 ENCOUNTER — Other Ambulatory Visit: Payer: Self-pay

## 2022-10-04 ENCOUNTER — Other Ambulatory Visit (INDEPENDENT_AMBULATORY_CARE_PROVIDER_SITE_OTHER): Payer: Medicare Other | Admitting: Family

## 2022-10-04 ENCOUNTER — Emergency Department (HOSPITAL_COMMUNITY)
Admission: EM | Admit: 2022-10-04 | Discharge: 2022-10-06 | Disposition: A | Discharge status: Skilled Nursing Facility | Payer: Medicare Other | Attending: Emergency Medicine | Admitting: Emergency Medicine

## 2022-10-04 DIAGNOSIS — F0390 Unspecified dementia without behavioral disturbance: Secondary | ICD-10-CM | POA: Diagnosis not present

## 2022-10-04 DIAGNOSIS — I129 Hypertensive chronic kidney disease with stage 1 through stage 4 chronic kidney disease, or unspecified chronic kidney disease: Secondary | ICD-10-CM | POA: Diagnosis not present

## 2022-10-04 DIAGNOSIS — E039 Hypothyroidism, unspecified: Secondary | ICD-10-CM | POA: Diagnosis not present

## 2022-10-04 DIAGNOSIS — Z79899 Other long term (current) drug therapy: Secondary | ICD-10-CM | POA: Diagnosis not present

## 2022-10-04 DIAGNOSIS — N1831 Chronic kidney disease, stage 3a: Secondary | ICD-10-CM | POA: Diagnosis not present

## 2022-10-04 DIAGNOSIS — Z7982 Long term (current) use of aspirin: Secondary | ICD-10-CM | POA: Insufficient documentation

## 2022-10-04 DIAGNOSIS — M6281 Muscle weakness (generalized): Secondary | ICD-10-CM | POA: Insufficient documentation

## 2022-10-04 DIAGNOSIS — R410 Disorientation, unspecified: Secondary | ICD-10-CM | POA: Diagnosis not present

## 2022-10-04 DIAGNOSIS — F419 Anxiety disorder, unspecified: Secondary | ICD-10-CM | POA: Insufficient documentation

## 2022-10-04 DIAGNOSIS — Z20822 Contact with and (suspected) exposure to covid-19: Secondary | ICD-10-CM | POA: Diagnosis not present

## 2022-10-04 DIAGNOSIS — R2689 Other abnormalities of gait and mobility: Secondary | ICD-10-CM | POA: Diagnosis not present

## 2022-10-04 DIAGNOSIS — R2681 Unsteadiness on feet: Secondary | ICD-10-CM | POA: Insufficient documentation

## 2022-10-04 DIAGNOSIS — R6889 Other general symptoms and signs: Secondary | ICD-10-CM | POA: Diagnosis not present

## 2022-10-04 DIAGNOSIS — N390 Urinary tract infection, site not specified: Secondary | ICD-10-CM

## 2022-10-04 DIAGNOSIS — N3281 Overactive bladder: Secondary | ICD-10-CM

## 2022-10-04 DIAGNOSIS — Z743 Need for continuous supervision: Secondary | ICD-10-CM | POA: Diagnosis not present

## 2022-10-04 LAB — COMPREHENSIVE METABOLIC PANEL
ALT: 39 U/L (ref 0–44)
AST: 31 U/L (ref 15–41)
Albumin: 4.1 g/dL (ref 3.5–5.0)
Alkaline Phosphatase: 52 U/L (ref 38–126)
Anion gap: 13 (ref 5–15)
BUN: 14 mg/dL (ref 8–23)
CO2: 19 mmol/L — ABNORMAL LOW (ref 22–32)
Calcium: 9.4 mg/dL (ref 8.9–10.3)
Chloride: 103 mmol/L (ref 98–111)
Creatinine, Ser: 0.97 mg/dL (ref 0.44–1.00)
GFR, Estimated: 57 mL/min — ABNORMAL LOW (ref >60)
Glucose, Bld: 107 mg/dL — ABNORMAL HIGH (ref 70–99)
Potassium: 3.5 mmol/L (ref 3.5–5.1)
Sodium: 135 mmol/L (ref 135–145)
Total Bilirubin: 1.1 mg/dL (ref 0.3–1.2)
Total Protein: 6.9 g/dL (ref 6.5–8.1)

## 2022-10-04 LAB — CBC WITH DIFFERENTIAL/PLATELET
Abs Immature Granulocytes: 0.02 10*3/uL (ref 0.00–0.07)
Basophils Absolute: 0.1 10*3/uL (ref 0.0–0.1)
Basophils Relative: 1 %
Eosinophils Absolute: 0.2 10*3/uL (ref 0.0–0.5)
Eosinophils Relative: 3 %
HCT: 44.5 % (ref 36.0–46.0)
Hemoglobin: 15.7 g/dL — ABNORMAL HIGH (ref 12.0–15.0)
Immature Granulocytes: 0 %
Lymphocytes Relative: 29 %
Lymphs Abs: 2.3 10*3/uL (ref 0.7–4.0)
MCH: 31.6 pg (ref 26.0–34.0)
MCHC: 35.3 g/dL (ref 30.0–36.0)
MCV: 89.5 fL (ref 80.0–100.0)
Monocytes Absolute: 0.5 10*3/uL (ref 0.1–1.0)
Monocytes Relative: 7 %
Neutro Abs: 4.8 10*3/uL (ref 1.7–7.7)
Neutrophils Relative %: 60 %
Platelets: 249 10*3/uL (ref 150–400)
RBC: 4.97 MIL/uL (ref 3.87–5.11)
RDW: 13 % (ref 11.5–15.5)
WBC: 7.9 10*3/uL (ref 4.0–10.5)
nRBC: 0 % (ref 0.0–0.2)

## 2022-10-04 LAB — AMMONIA: Ammonia: 16 umol/L (ref 9–35)

## 2022-10-04 LAB — POCT URINALYSIS DIPSTICK
Blood, UA: NEGATIVE
Glucose, UA: NEGATIVE
Ketones, UA: NEGATIVE
Leukocytes, UA: NEGATIVE
Nitrite, UA: NEGATIVE
Protein, UA: POSITIVE — AB
Spec Grav, UA: >=1.03 — AB (ref 1.010–1.025)
Urobilinogen, UA: 0.2 E.U./dL
pH, UA: 6 (ref 5.0–8.0)

## 2022-10-04 LAB — URINALYSIS, W/ REFLEX TO CULTURE (INFECTION SUSPECTED)
Bilirubin Urine: NEGATIVE
Glucose, UA: NEGATIVE mg/dL
Ketones, ur: 5 mg/dL — AB
Nitrite: NEGATIVE
Protein, ur: NEGATIVE mg/dL
Specific Gravity, Urine: 1.005 (ref 1.005–1.030)
pH: 7 (ref 5.0–8.0)

## 2022-10-04 LAB — LACTIC ACID, PLASMA: Lactic Acid, Venous: 1.3 mmol/L (ref 0.5–1.9)

## 2022-10-04 LAB — TSH: TSH: 0.869 u[IU]/mL (ref 0.350–4.500)

## 2022-10-04 LAB — SARS CORONAVIRUS 2 BY RT PCR: SARS Coronavirus 2 by RT PCR: NEGATIVE

## 2022-10-04 MED ORDER — LORAZEPAM 2 MG/ML IJ SOLN
0.5000 mg | Freq: Once | INTRAMUSCULAR | Status: AC
  Administered 2022-10-05: 0.5 mg via INTRAVENOUS
  Filled 2022-10-04: qty 1

## 2022-10-04 MED ORDER — LEVOTHYROXINE SODIUM 50 MCG PO TABS: 75.0000 ug | ORAL_TABLET | Freq: Every day | ORAL | Status: DC

## 2022-10-04 MED ORDER — ASPIRIN 81 MG PO CHEW: 81.0000 mg | CHEWABLE_TABLET | Freq: Every day | ORAL | Status: DC

## 2022-10-04 MED ORDER — CEPHALEXIN 250 MG PO CAPS
250.0000 mg | ORAL_CAPSULE | Freq: Every day | ORAL | Status: DC
  Administered 2022-10-04 – 2022-10-06 (×3): 250 mg via ORAL
  Filled 2022-10-04 (×7): qty 1

## 2022-10-04 MED ORDER — VITAMIN D (ERGOCALCIFEROL) 1.25 MG (50000 UNIT) PO CAPS: 50000.0000 [IU] | ORAL_CAPSULE | ORAL | Status: DC

## 2022-10-04 MED ORDER — SIMVASTATIN 10 MG PO TABS
20.0000 mg | ORAL_TABLET | Freq: Every day | ORAL | Status: DC
  Administered 2022-10-04 – 2022-10-05 (×2): 20 mg via ORAL
  Filled 2022-10-04 (×2): qty 2

## 2022-10-04 MED ORDER — SODIUM CHLORIDE 0.9 % IV BOLUS
30.0000 mL/kg | Freq: Once | INTRAVENOUS | Status: DC
  Administered 2022-10-04: 1638 mL via INTRAVENOUS

## 2022-10-04 MED ORDER — PANTOPRAZOLE SODIUM 40 MG PO TBEC: 40.0000 mg | DELAYED_RELEASE_TABLET | Freq: Every day | ORAL | Status: DC

## 2022-10-04 MED ORDER — PANTOPRAZOLE SODIUM 40 MG PO TBEC
40.0000 mg | DELAYED_RELEASE_TABLET | Freq: Every day | ORAL | Status: DC
  Administered 2022-10-04 – 2022-10-05 (×2): 40 mg via ORAL
  Filled 2022-10-04 (×2): qty 1

## 2022-10-04 MED ORDER — ASPIRIN 81 MG PO CHEW
81.0000 mg | CHEWABLE_TABLET | Freq: Every day | ORAL | Status: DC
  Administered 2022-10-04 – 2022-10-05 (×2): 81 mg via ORAL
  Filled 2022-10-04 (×2): qty 1

## 2022-10-04 MED ORDER — LEVOTHYROXINE SODIUM 50 MCG PO TABS
75.0000 ug | ORAL_TABLET | Freq: Every day | ORAL | Status: DC
  Administered 2022-10-05 – 2022-10-06 (×2): 75 ug via ORAL
  Filled 2022-10-04 (×2): qty 2

## 2022-10-04 MED ORDER — MIRABEGRON ER 25 MG PO TB24
25.0000 mg | ORAL_TABLET | Freq: Every day | ORAL | Status: DC
  Administered 2022-10-05 – 2022-10-06 (×2): 25 mg via ORAL
  Filled 2022-10-04 (×2): qty 1

## 2022-10-04 MED ORDER — QUETIAPINE FUMARATE 50 MG PO TABS: 50.0000 mg | ORAL_TABLET | Freq: Every day | ORAL | 0 refills | Status: DC

## 2022-10-04 MED ORDER — ROPINIROLE HCL 0.25 MG PO TABS
0.2500 mg | ORAL_TABLET | Freq: Every day | ORAL | Status: DC
  Administered 2022-10-04 – 2022-10-05 (×2): 0.25 mg via ORAL
  Filled 2022-10-04 (×3): qty 1

## 2022-10-04 MED ORDER — AMLODIPINE BESYLATE 5 MG PO TABS
2.5000 mg | ORAL_TABLET | Freq: Every day | ORAL | Status: DC
  Administered 2022-10-05 – 2022-10-06 (×2): 2.5 mg via ORAL
  Filled 2022-10-04 (×2): qty 1

## 2022-10-04 MED ORDER — BENAZEPRIL HCL 5 MG PO TABS
5.0000 mg | ORAL_TABLET | Freq: Every day | ORAL | Status: DC
  Administered 2022-10-05 – 2022-10-06 (×2): 5 mg via ORAL
  Filled 2022-10-04 (×2): qty 1

## 2022-10-04 MED ORDER — LORAZEPAM 2 MG/ML IJ SOLN
0.5000 mg | Freq: Once | INTRAMUSCULAR | Status: AC
  Administered 2022-10-04: 0.5 mg via INTRAVENOUS
  Filled 2022-10-04: qty 1

## 2022-10-04 MED ORDER — PAROXETINE HCL 20 MG PO TABS
30.0000 mg | ORAL_TABLET | Freq: Every day | ORAL | Status: DC
  Administered 2022-10-05 – 2022-10-06 (×2): 30 mg via ORAL
  Filled 2022-10-04 (×2): qty 1

## 2022-10-04 MED ORDER — METOPROLOL SUCCINATE ER 25 MG PO TB24
25.0000 mg | ORAL_TABLET | Freq: Every day | ORAL | Status: DC
  Administered 2022-10-05 – 2022-10-06 (×2): 25 mg via ORAL
  Filled 2022-10-04 (×2): qty 1

## 2022-10-04 NOTE — ED Notes (Signed)
Pt is extremely restless, attempting to get out of bed, son at bedside states " she is trying to get out of bed every 5 seconds, it is getting harder and harder to redirect her" MD made aware

## 2022-10-04 NOTE — ED Triage Notes (Signed)
Pt has been altered and urine smells foul.

## 2022-10-04 NOTE — ED Provider Notes (Signed)
[facility] Provider Note  CSN: [id number] Arrival date & time: 10/04/22 1600  Chief Complaint(s) Altered Mental Status  HPI Amy Roth is a 89 y.o. female history of dementia, hypertension, hyperlipidemia, hypothyroidism, CKD presenting to the emergency department with confusion.  Patient son reports that she does have some dementia at baseline but able to do ADLs, has lately been increasingly confused.  He reports that this has happened before when she has had an infection such as a urinary infection.  Went to her primary office, urine dip was apparently indeterminate and sent to the emergency department for further evaluation.  Patient denies any pain anywhere but is tearful and trying to get out of the bed.  Son does not think she hit her head or fell.  No fevers, vomiting, coughing.  History limited due to dementia and altered mental status.   Past Medical History Past Medical History:  Diagnosis Date   AKI (acute kidney injury)    Anxiety    Arthritis    Arthritis    Coffee ground emesis    Complication of anesthesia    hard to wake up   Depression    DVT (deep venous thrombosis)    Dysrhythmia    Edema    feet/legs   GERD (gastroesophageal reflux disease)    Heart murmur    History of stroke 04/28/2022   Hyperlipidemia    Hypertension    Hyponatremia    Hypothyroidism    N&V (nausea and vomiting) 04/28/2022   Palpitation    Stroke    facial   Patient Active Problem List   Diagnosis Date Noted   Overactive detrusor 09/19/2022   Dementia 08/26/2022   Lower urinary tract infectious disease    Chronic hyponatremia 02/04/2022   Altered mental state 02/02/2022   Chronic kidney disease, stage 3a 02/14/2021   Hypertension    Hypothyroidism    Depression    Osteoporosis 05/10/2018   Primary localized osteoarthrosis, lower leg 05/10/2018   Vitamin D deficiency 05/10/2018   Generalized anxiety disorder 05/10/2018    Home Medication(s) Prior to Admission medications   Medication Sig Start Date End Date Taking? Authorizing Provider  amLODipine (NORVASC) 5 MG tablet Take 0.5 tablets (2.5 mg total) by mouth daily. 04/29/22   Barton Dubois, MD  aspirin 81 MG tablet Take 81 mg by mouth daily.    [provider]  benazepril (LOTENSIN) 5 MG tablet Take 5 mg by mouth daily. 02/15/22   [provider]  cephALEXin (KEFLEX) 250 MG capsule Take 1 capsule (250 mg total) by mouth daily. 08/26/22   Mechele Claude, FNP  docusate sodium (COLACE) 100 MG capsule Take 100 mg by mouth 2 (two) times daily as needed for mild constipation.    [provider]  levothyroxine (SYNTHROID) 75 MCG tablet Take 1 tablet (75 mcg total) by mouth daily before breakfast. 10/24/20   Elgergawy, Silver Huguenin, MD  meloxicam (MOBIC) 15 MG tablet Take 1 tablet (15 mg total) by mouth daily. 05/03/22   Barton Dubois, MD  metoprolol succinate (TOPROL-XL) 25 MG 24 hr tablet Take 25 mg by mouth daily.    [provider]  pantoprazole (PROTONIX) 40 MG tablet Take 1 tablet (40 mg total) by mouth 2 (two) times daily. After one month, can take medication once a day only. 04/29/22   Barton Dubois, MD  PARoxetine (PAXIL) 30 MG tablet Take 30 mg by mouth daily. 03/23/22   [provider]  rOPINIRole (REQUIP) 0.25 MG tablet Take 1 tablet (0.25 mg total) by mouth at bedtime. 09/19/22   Mechele Claude, FNP  simvastatin (ZOCOR) 20 MG tablet Take 20 mg by mouth at bedtime.    [provider]  Vibegron (GEMTESA) 75 MG TABS Take 1 tablet (75 mg total) by mouth daily. 09/19/22   Mechele Claude, FNP  Vitamin D, Ergocalciferol, (DRISDOL) 1.25 MG (50000 UNIT) CAPS capsule Take 1 capsule by mouth once a week. 08/10/20   [provider]                                                                                                                                    Past Surgical History Past Surgical History:   Procedure Laterality Date   BELPHAROPTOSIS REPAIR     BREAST BIOPSY Left 2004   benign   CARDIAC CATHETERIZATION     CATARACT EXTRACTION W/PHACO Left 04/08/2015   Procedure: CATARACT EXTRACTION PHACO AND INTRAOCULAR LENS PLACEMENT ([facility]);  Surgeon: Leandrew Koyanagi, MD;  Location: [facility] OR[facility];  Service: Ophthalmology;  Laterality: Left;  US01:11.3 AP15.9 CDE11.33 FLUID LOT# [id number]   CATARACT EXTRACTION W/PHACO Right 06/03/2015   Procedure: CATARACT EXTRACTION PHACO AND INTRAOCULAR LENS PLACEMENT (IOC);  Surgeon: Leandrew Koyanagi, MD;  Location: [facility] ORS;  Service: Ophthalmology;  Laterality: Right;  [place]  1:12.8 AP   19.3 CDE  13.23 casette lot [id number]   CHOLECYSTECTOMY     ESOPHAGOGASTRODUODENOSCOPY (EGD) WITH PROPOFOL N/A 04/29/2022   Procedure: ESOPHAGOGASTRODUODENOSCOPY (EGD) WITH PROPOFOL;  Surgeon: Daneil Dolin, MD;  Location: [facility];  Service: Endoscopy;  Laterality: N/A;   NECK SURGERY     TUBAL LIGATION     Family History Family History  Problem Relation Age of Onset   Hypertension Father    Heart disease Father    Hypertension Mother    Heart disease Mother    Breast cancer Neg Hx     Social History Social History   Tobacco Use   Smoking status: Never   Smokeless tobacco: Never  Vaping Use   Vaping Use: Never used  Substance Use Topics   Alcohol use: Never   Drug use: Never   Allergies Nitrofurantoin macrocrystal, Penicillins, and Penicillins  Review of Systems Review of Systems  All other systems reviewed and are negative.   Physical Exam Vital Signs  I have reviewed the triage vital signs BP 122/62   Pulse 63   Temp 99.5 F (37.5 C) (Rectal)   Resp 17   Ht 5\' 1"  (1.549 m)   Wt 64.7 kg   SpO2 93%   BMI 26.95 kg/m  Physical Exam Vitals and nursing note reviewed.  Constitutional:      General: She is not in acute distress.    Appearance: She is well-developed.  HENT:     Head: Normocephalic and atraumatic.     Mouth/Throat:      Mouth:  Mucous membranes are dry.  Eyes:     Pupils: Pupils are equal, round, and reactive to light.  Cardiovascular:     Rate and Rhythm: Normal rate and regular rhythm.     Heart sounds: No murmur heard. Pulmonary:     Effort: Pulmonary effort is normal. No respiratory distress.     Breath sounds: Normal breath sounds.  Abdominal:     General: Abdomen is flat.     Palpations: Abdomen is soft.     Tenderness: There is no abdominal tenderness.  Musculoskeletal:        General: No tenderness.     Cervical back: Neck supple.     Right lower leg: No edema.     Left lower leg: No edema.  Skin:    General: Skin is warm and dry.  Neurological:     Mental Status: She is alert.     Comments: Cranial nerves II through XII intact, moving all 4 extremities equally.  Oriented to self and that she is in the hospital but does not know why she is here or what year it is  Psychiatric:     Comments: Tearful, anxious     ED Results and Treatments Labs (all labs ordered are listed, but only abnormal results are displayed) Labs Reviewed  COMPREHENSIVE METABOLIC PANEL - Abnormal; Notable for the following components:      Result Value   CO2 19 (*)    Glucose, Bld 107 (*)    GFR, Estimated 57 (*)    All other components within normal limits  CBC WITH DIFFERENTIAL/PLATELET - Abnormal; Notable for the following components:   Hemoglobin 15.7 (*)    All other components within normal limits  URINALYSIS, W/ REFLEX TO CULTURE (INFECTION SUSPECTED) - Abnormal; Notable for the following components:   Color, Urine STRAW (*)    Hgb urine dipstick MODERATE (*)    Ketones, ur 5 (*)    Leukocytes,Ua TRACE (*)    Bacteria, UA RARE (*)    All other components within normal limits  CULTURE, BLOOD (ROUTINE X 2)  SARS CORONAVIRUS 2 BY RT PCR  CULTURE, BLOOD (ROUTINE X 2)  LACTIC ACID, PLASMA  AMMONIA  TSH                                                                                                                           Radiology CT Head Wo Contrast  Result Date: 10/04/2022 CLINICAL DATA:  Altered mental status EXAM: CT HEAD WITHOUT CONTRAST TECHNIQUE: Contiguous axial images were obtained from the base of the skull through the vertex without intravenous contrast. RADIATION DOSE REDUCTION: This exam was performed according to the departmental dose-optimization program which includes automated exposure control, adjustment of the mA and/or kV according to patient size and/or use of iterative reconstruction technique. COMPARISON:  02/02/2022 FINDINGS: Brain: No acute intracranial findings are seen. There are no signs of bleeding within the cranium. Cortical sulci are prominent. There is decreased  density in periventricular white matter. Vascular: Scattered arterial calcifications are seen. Skull: Unremarkable. Sinuses/Orbits: Unremarkable. Other: None. IMPRESSION: No acute intracranial findings are seen in noncontrast CT brain. Atrophy. Small-vessel disease. Arteriosclerosis. Electronically Signed   By: Elmer Picker M.D.   On: 10/04/2022 20:43   DG Chest Port 1 View  Result Date: 10/04/2022 CLINICAL DATA:  Altered mental status.  Confusion. EXAM: PORTABLE CHEST 1 VIEW COMPARISON:  Radiographs 10/23/2020.  Abdominal CT 04/28/2022. FINDINGS: 1712 hours. The heart size and mediastinal contours are stable. There is chronic elevation of the right hemidiaphragm associated with chronic mild bibasilar atelectasis. No edema, confluent airspace opacity, pleural effusion or pneumothorax. No acute osseous findings are evident. Mild degenerative changes in the spine. Telemetry leads overlie the chest. IMPRESSION: No evidence of acute cardiopulmonary process. Chronic elevation of the right hemidiaphragm. Electronically Signed   By: Richardean Sale M.D.   On: 10/04/2022 17:38    Pertinent labs & imaging results that were available during my care of the patient were reviewed by me and considered in my medical  decision making (see MDM for details).  Medications Ordered in ED Medications  LORazepam (ATIVAN) injection 0.5 mg (0.5 mg Intravenous Not Given 10/04/22 2239)  amLODipine (NORVASC) tablet 2.5 mg (has no administration in time range)  benazepril (LOTENSIN) tablet 5 mg (has no administration in time range)  aspirin tablet 81 mg (has no administration in time range)  levothyroxine (SYNTHROID) tablet 75 mcg (has no administration in time range)  metoprolol succinate (TOPROL-XL) 24 hr tablet 25 mg (has no administration in time range)  pantoprazole (PROTONIX) EC tablet 40 mg (has no administration in time range)  PARoxetine (PAXIL) tablet 30 mg (has no administration in time range)  rOPINIRole (REQUIP) tablet 0.25 mg (has no administration in time range)  simvastatin (ZOCOR) tablet 20 mg (has no administration in time range)  Vitamin D (Ergocalciferol) (DRISDOL) 1.25 MG (50000 UNIT) capsule 50,000 Units (has no administration in time range)  mirabegron ER (MYRBETRIQ) tablet 25 mg (has no administration in time range)  sodium chloride 0.9 % bolus 1,638 mL (1,638 mLs Intravenous Bolus 10/04/22 1733)  LORazepam (ATIVAN) injection 0.5 mg (0.5 mg Intravenous Given 10/04/22 1733)                                                                                                                                     Procedures Procedures  (including critical care time)  Medical Decision Making / ED Course   MDM:  89 year old female presenting to the emergency department with confusion.  Patient in no distress but slightly agitated.  Per son is more confused than typical.  Vital signs with mild hypertension, no fever or tachycardia.  Will obtain workup for altered mental status.  Differential includes intracranial process such as bleeding, will obtain CT head.  No signs of head trauma on physical exam.  Will also obtain infectious workup including chest x-ray, urinalysis  and COVID swab.  No abdominal  tenderness to suggest intra-abdominal process and no reported vomiting.  No skin lesions to suggest cellulitis.  No meningismus.  Differential also includes toxic or metabolic encephalopathies will obtain labs including CMP, TSH and ammonia.  Will reassess.  Patient appears very dehydrated so we will give fluids.  Clinical Course as of 10/04/22 2240  Wed Oct 04, 2022  2233 Workup overall reassuring.  CT head without evidence of acute process.  Normal lactate.  Normal TSH and ammonia.  Urinalysis not very concerning for urinary infection with 0-5 WBCs.  Discussed further with the patient's daughter at the bedside reports that this is actually been a subacute process going on for some time now. Patient has had gradual decline over previous 6 months.  The patient has been not sleeping very much and agitated during the day.  She has been trying to treat it with Benadryl but the patient is sedated awake.  She is having trouble at home with this, she lives with the patient.  Unsure whether she would want placement for the patient.  Would appreciate social work consultation and physical therapy evaluation.  Places boarding status and consult TOC for additional resources versus home health.  Placed physical therapy consult as well. [WS]    Clinical Course User Index [WS] Cristie Hem, MD     Additional history obtained: -Additional history obtained from family -External records from outside source obtained and reviewed including: Chart review including previous notes, labs, imaging, consultation notes including urine dip today   Lab Tests: -I ordered, reviewed, and interpreted labs.   The pertinent results include:   Labs Reviewed  COMPREHENSIVE METABOLIC PANEL - Abnormal; Notable for the following components:      Result Value   CO2 19 (*)    Glucose, Bld 107 (*)    GFR, Estimated 57 (*)    All other components within normal limits  CBC WITH DIFFERENTIAL/PLATELET - Abnormal; Notable for the  following components:   Hemoglobin 15.7 (*)    All other components within normal limits  URINALYSIS, W/ REFLEX TO CULTURE (INFECTION SUSPECTED) - Abnormal; Notable for the following components:   Color, Urine STRAW (*)    Hgb urine dipstick MODERATE (*)    Ketones, ur 5 (*)    Leukocytes,Ua TRACE (*)    Bacteria, UA RARE (*)    All other components within normal limits  CULTURE, BLOOD (ROUTINE X 2)  SARS CORONAVIRUS 2 BY RT PCR  CULTURE, BLOOD (ROUTINE X 2)  LACTIC ACID, PLASMA  AMMONIA  TSH    Notable for reassuring UA, no anemia, normal ammonia, normal TSH, nromal lactic acid   Imaging Studies ordered: I ordered imaging studies including CT head, CXR On my interpretation imaging demonstrates no acute process I independently visualized and interpreted imaging. I agree with the radiologist interpretation   Medicines ordered and prescription drug management: Meds ordered this encounter  Medications   sodium chloride 0.9 % bolus 1,638 mL   LORazepam (ATIVAN) injection 0.5 mg   LORazepam (ATIVAN) injection 0.5 mg   amLODipine (NORVASC) tablet 2.5 mg   benazepril (LOTENSIN) tablet 5 mg   aspirin tablet 81 mg   levothyroxine (SYNTHROID) tablet 75 mcg   metoprolol succinate (TOPROL-XL) 24 hr tablet 25 mg   pantoprazole (PROTONIX) EC tablet 40 mg   PARoxetine (PAXIL) tablet 30 mg   rOPINIRole (REQUIP) tablet 0.25 mg   simvastatin (ZOCOR) tablet 20 mg   Vitamin D (Ergocalciferol) (DRISDOL) 1.25  MG (50000 UNIT) capsule 50,000 Units   mirabegron ER (MYRBETRIQ) tablet 25 mg    -I have reviewed the patients home medicines and have made adjustments as needed    Cardiac Monitoring: The patient was maintained on a cardiac monitor.  I personally viewed and interpreted the cardiac monitored which showed an underlying rhythm of: NSR  Reevaluation: After the interventions noted above, I reevaluated the patient and found that their symptoms have improved  Co morbidities that  complicate the patient evaluation  Past Medical History:  Diagnosis Date   AKI (acute kidney injury)    Anxiety    Arthritis    Arthritis    Coffee ground emesis    Complication of anesthesia    hard to wake up   Depression    DVT (deep venous thrombosis)    Dysrhythmia    Edema    feet/legs   GERD (gastroesophageal reflux disease)    Heart murmur    History of stroke 04/28/2022   Hyperlipidemia    Hypertension    Hyponatremia    Hypothyroidism    N&V (nausea and vomiting) 04/28/2022   Palpitation    Stroke    facial      Dispostion: Disposition decision including need for hospitalization was considered, and patient boarding for social work resources and physical therapy evaluation    Final Clinical Impression(s) / ED Diagnoses Final diagnoses:  Delirium     This chart was dictated using voice recognition software.  Despite best efforts to proofread,  errors can occur which can change the documentation meaning.    Cristie Hem, MD 10/04/22 2240

## 2022-10-04 NOTE — ED Notes (Signed)
Daughter no longer at bedside. Patient is resting in bed.

## 2022-10-04 NOTE — Addendum Note (Signed)
Addended by: Georgian Co on: 10/04/2022 04:16 PM   Modules accepted: Orders

## 2022-10-05 LAB — URINALYSIS, ROUTINE W REFLEX MICROSCOPIC
Bilirubin, UA: NEGATIVE
Glucose, UA: NEGATIVE
Ketones, UA: NEGATIVE
Leukocytes,UA: NEGATIVE
Nitrite, UA: NEGATIVE
Protein,UA: NEGATIVE
RBC, UA: NEGATIVE
Specific Gravity, UA: 1.022 (ref 1.005–1.030)
Urobilinogen, Ur: 0.2 mg/dL (ref 0.2–1.0)
pH, UA: 5.5 (ref 5.0–7.5)

## 2022-10-05 NOTE — ED Notes (Addendum)
TOC consulted for SNF. CSW spoke with pts son who is agreeable to SNF. CSW explained that referral will be sent out and bed offers will be reviewed with family. CSW to complete PASRR and Fl2. Clinicals to be sent to local facilities for review. TOC to follow.   Addendum 10:50- CSW presented pts bed offers to pts son, he will speak with his sister and then provide update to CSW. TOC to follow.   Addendum 11:40- CSW spoke with pts son in ED. Pts son and daughter have selected [facility] SNF. TOC to start auth at this time. TOC to follow.

## 2022-10-05 NOTE — ED Notes (Signed)
Patient is resting in bed. Call light is within reach.

## 2022-10-05 NOTE — Evaluation (Signed)
Physical Therapy Evaluation Patient Details Name: Amy Roth MRN: [id number] DOB: October 01, 1933 Today's Date: 10/05/2022  History of Present Illness  Amy Roth is a 89 y.o. female history of dementia, hypertension, hyperlipidemia, hypothyroidism, CKD presenting to the emergency department with confusion.  Patient son reports that she does have some dementia at baseline but able to do ADLs, has lately been increasingly confused.  He reports that this has happened before when she has had an infection such as a urinary infection.  Went to her primary office, urine dip was apparently indeterminate and sent to the emergency department for further evaluation.  Patient denies any pain anywhere but is tearful and trying to get out of the bed.  Son does not think she hit her head or fell.  No fevers, vomiting, coughing.  History limited due to dementia and altered mental status.   Clinical Impression  Patient demonstrates slow labored movement for sitting up at bedside requiring Mod assist due to weakness, limited to a few side steps at bedside before having to sit due to BLE weakness and poor standing balance using RW.  Patient tolerated sitting up in chair with her son present in room after therapy - nursing staff notified.  Patient will benefit from continued skilled physical therapy in hospital and recommended venue below to increase strength, balance, endurance for safe ADLs and gait.          Recommendations for follow up therapy are one component of a multi-disciplinary discharge planning process, led by the attending physician.  Recommendations may be updated based on patient status, additional functional criteria and insurance authorization.  Follow Up Recommendations Can patient physically be transported by private vehicle: Yes     Assistance Recommended at Discharge Intermittent Supervision/Assistance  Patient can return home with the following  A lot of help with walking  and/or transfers;A lot of help with bathing/dressing/bathroom;Assistance with cooking/housework;Help with stairs or ramp for entrance    Equipment Recommendations None recommended by PT  Recommendations for Other Services       Functional Status Assessment Patient has had a recent decline in their functional status and demonstrates the ability to make significant improvements in function in a reasonable and predictable amount of time.     Precautions / Restrictions Precautions Precautions: Fall Restrictions Weight Bearing Restrictions: No      Mobility  Bed Mobility Overal bed mobility: Needs Assistance Bed Mobility: Supine to Sit     Supine to sit: Mod assist     General bed mobility comments: increased time, labored movement    Transfers Overall transfer level: Needs assistance Equipment used: Rolling walker (2 wheels) Transfers: Sit to/from Stand, Bed to chair/wheelchair/BSC Sit to Stand: Min assist   Step pivot transfers: Mod assist       General transfer comment: slow labored movement    Ambulation/Gait Ambulation/Gait assistance: Mod assist Gait Distance (Feet): 3 Feet Assistive device: Rolling walker (2 wheels) Gait Pattern/deviations: Decreased step length - right, Decreased step length - left, Decreased stride length, Knees buckling Gait velocity: decreased     General Gait Details: limited to a few side steps before having to sit due to c/o fatigue, generalized weakness  Stairs            Wheelchair Mobility    Modified Rankin (Stroke Patients Only)       Balance Overall balance assessment: Needs assistance Sitting-balance support: Feet supported, No upper extremity supported Sitting balance-Leahy Scale: Fair Sitting balance - Comments: seated at EOB  Standing balance support: Reliant on assistive device for balance, During functional activity, Bilateral upper extremity supported Standing balance-Leahy Scale: Poor Standing balance  comment: fair/poor using RW                             Pertinent Vitals/Pain Pain Assessment Pain Assessment: No/denies pain    Home Living Family/patient expects to be discharged to:: Private residence Living Arrangements: Children Available Help at Discharge: Family;Available PRN/intermittently Type of Home: House Home Access: Stairs to enter Entrance Stairs-Rails: None Entrance Stairs-Number of Steps: 1   Home Layout: One level Home Equipment: Cane - single Barista (2 wheels);Shower seat;Grab bars - tub/shower      Prior Function Prior Level of Function : Needs assist       Physical Assist : Mobility (physical);ADLs (physical) Mobility (physical): Bed mobility;Transfers;Gait;Stairs ADLs (physical): Bathing Mobility Comments: household ambulator using RW PRN ADLs Comments: assisted by family     Hand Dominance        Extremity/Trunk Assessment   Upper Extremity Assessment Upper Extremity Assessment: Generalized weakness    Lower Extremity Assessment Lower Extremity Assessment: Generalized weakness    Cervical / Trunk Assessment Cervical / Trunk Assessment: Normal  Communication   Communication: HOH  Cognition Arousal/Alertness: Awake/alert Behavior During Therapy: WFL for tasks assessed/performed Overall Cognitive Status: History of cognitive impairments - at baseline                                          General Comments      Exercises     Assessment/Plan    PT Assessment Patient needs continued PT services  PT Problem List Decreased strength;Decreased activity tolerance;Decreased balance;Decreased mobility       PT Treatment Interventions DME instruction;Gait training;Stair training;Functional mobility training;Therapeutic activities;Therapeutic exercise;Balance training;Patient/family education    PT Goals (Current goals can be found in the Care Plan section)  Acute Rehab PT Goals Patient Stated  Goal: return home PT Goal Formulation: With patient/family Time For Goal Achievement: 10/19/22 Potential to Achieve Goals: Good    Frequency Min 2X/week     Co-evaluation               AM-PAC PT "6 Clicks" Mobility  Outcome Measure Help needed turning from your back to your side while in a flat bed without using bedrails?: A Little Help needed moving from lying on your back to sitting on the side of a flat bed without using bedrails?: A Lot Help needed moving to and from a bed to a chair (including a wheelchair)?: A Lot Help needed standing up from a chair using your arms (e.g., wheelchair or bedside chair)?: A Lot Help needed to walk in hospital room?: A Lot Help needed climbing 3-5 steps with a railing? : A Lot 6 Click Score: 13    End of Session   Activity Tolerance: Patient limited by fatigue;Patient tolerated treatment well Patient left: in chair;with call bell/phone within reach;with family/visitor present Nurse Communication: Mobility status PT Visit Diagnosis: Unsteadiness on feet (R26.81);Other abnormalities of gait and mobility (R26.89);Muscle weakness (generalized) (M62.81)    Time: [id number] PT Time Calculation (min) (ACUTE ONLY): 20 min   Charges:   PT Evaluation $PT Eval Moderate Complexity: 1 Mod PT Treatments $Therapeutic Activity: 8-22 mins        12:02 PM, 10/05/22 Lonell Grandchild, MPT Physical Therapist  with [facility] 336 [phone] office [phone] mobile phone

## 2022-10-05 NOTE — ED Notes (Addendum)
Walked into room. Patient stated she wanted to sit up in a chair. Patient then stated she was feeling weak and tired. I advised if she felt tired it would be best for her to rest and allow the physical therapist to evaluate her. Patient agreed.

## 2022-10-05 NOTE — ED Notes (Signed)
PT recommends rehab for pt

## 2022-10-05 NOTE — ED Notes (Signed)
Aldona Bar, social work, filling out [employer] for pt at this time

## 2022-10-05 NOTE — NC FL2 (Signed)
[place] MEDICAID FL2 LEVEL OF CARE FORM     IDENTIFICATION  Patient Name: Amy Roth Birthdate: 05-Sep-1933 Sex: female Admission Date (Current Location): 10/04/2022  [facility] and [place] Number:  [employer] and Address:  [facility] [address], [place]      Provider Number: [id number]  Attending Physician Name and Address:  Default, Provider, MD  Relative Name and Phone Number:       Current Level of Care: Hospital Recommended Level of Care: [place] Prior Approval Number:    Date Approved/Denied:   PASRR Number: [id number]  Discharge Plan: SNF    Current Diagnoses: Patient Active Problem List   Diagnosis Date Noted   Overactive detrusor 09/19/2022   Dementia 08/26/2022   Lower urinary tract infectious disease    Chronic hyponatremia 02/04/2022   Altered mental state 02/02/2022   Chronic kidney disease, stage 3a 02/14/2021   Hypertension    Hypothyroidism    Depression    Osteoporosis 05/10/2018   Primary localized osteoarthrosis, lower leg 05/10/2018   Vitamin D deficiency 05/10/2018   Generalized anxiety disorder 05/10/2018    Orientation RESPIRATION BLADDER Height & Weight     Self, Place  Normal Incontinent Weight: 142 lb 10.2 oz (64.7 kg) Height:  5\' 1"  (154.9 cm)  BEHAVIORAL SYMPTOMS/MOOD NEUROLOGICAL BOWEL NUTRITION STATUS      Continent Diet (Regular)  AMBULATORY STATUS COMMUNICATION OF NEEDS Skin   Extensive Assist Verbally Normal                       Personal Care Assistance Level of Assistance  Bathing, Feeding, Dressing Bathing Assistance: Limited assistance Feeding assistance: Independent Dressing Assistance: Limited assistance     Functional Limitations Info  Sight, Hearing, Speech Sight Info: Adequate Hearing Info: Adequate Speech Info: Adequate    SPECIAL CARE FACTORS FREQUENCY  PT (By licensed PT), OT (By licensed OT)     PT Frequency: 5 times  weekly OT Frequency: 5 times weekly            Contractures Contractures Info: Not present    Additional Factors Info  Code Status, Allergies Code Status Info: DNR Allergies Info: Nitrofurantoin Macrocrystal, Penicillins, Penicillins           Current Medications (10/05/2022):  This is the current hospital active medication list Current Facility-Administered Medications  Medication Dose Route Frequency Provider Last Rate Last Admin   amLODipine (NORVASC) tablet 2.5 mg  2.5 mg Oral Daily Cristie Hem, MD   2.5 mg at 10/05/22 0931   aspirin chewable tablet 81 mg  81 mg Oral Daily Cristie Hem, MD   81 mg at 10/04/22 2325   benazepril (LOTENSIN) tablet 5 mg  5 mg Oral Daily Cristie Hem, MD   5 mg at 10/05/22 0931   cephALEXin (KEFLEX) capsule 250 mg  250 mg Oral Daily Cristie Hem, MD   250 mg at 10/05/22 0940   levothyroxine (SYNTHROID) tablet 75 mcg  75 mcg Oral Q0600 Cristie Hem, MD   75 mcg at 10/05/22 [id number]   metoprolol succinate (TOPROL-XL) 24 hr tablet 25 mg  25 mg Oral Daily Cristie Hem, MD   25 mg at 10/05/22 0933   mirabegron ER (MYRBETRIQ) tablet 25 mg  25 mg Oral Daily Cristie Hem, MD   25 mg at 10/05/22 0933   pantoprazole (PROTONIX) EC tablet 40 mg  40 mg Oral Daily Garnette Gunner  L, MD   40 mg at 10/04/22 2325   PARoxetine (PAXIL) tablet 30 mg  30 mg Oral Daily Cristie Hem, MD   30 mg at 10/05/22 0933   rOPINIRole (REQUIP) tablet 0.25 mg  0.25 mg Oral QHS Cristie Hem, MD   0.25 mg at 10/04/22 2325   simvastatin (ZOCOR) tablet 20 mg  20 mg Oral QHS Cristie Hem, MD   20 mg at 10/04/22 2325   [START ON 10/09/2022] Vitamin D (Ergocalciferol) (DRISDOL) 1.25 MG (50000 UNIT) capsule 50,000 Units  50,000 Units Oral Weekly Cristie Hem, MD       Current Outpatient Medications  Medication Sig Dispense Refill   QUEtiapine (SEROQUEL) 50 MG tablet Take 1 tablet (50 mg total) by mouth at bedtime. 30  tablet 0   amLODipine (NORVASC) 5 MG tablet Take 0.5 tablets (2.5 mg total) by mouth daily.     aspirin 81 MG tablet Take 81 mg by mouth daily.     benazepril (LOTENSIN) 5 MG tablet Take 5 mg by mouth daily.     cephALEXin (KEFLEX) 250 MG capsule Take 1 capsule (250 mg total) by mouth daily. 90 capsule 1   docusate sodium (COLACE) 100 MG capsule Take 100 mg by mouth 2 (two) times daily as needed for mild constipation.     levothyroxine (SYNTHROID) 75 MCG tablet Take 1 tablet (75 mcg total) by mouth daily before breakfast. 30 tablet 1   meloxicam (MOBIC) 15 MG tablet Take 1 tablet (15 mg total) by mouth daily.     metoprolol succinate (TOPROL-XL) 25 MG 24 hr tablet Take 25 mg by mouth daily.     pantoprazole (PROTONIX) 40 MG tablet Take 1 tablet (40 mg total) by mouth 2 (two) times daily. After one month, can take medication once a day only. 60 tablet 1   PARoxetine (PAXIL) 30 MG tablet Take 30 mg by mouth daily.     rOPINIRole (REQUIP) 0.25 MG tablet Take 1 tablet (0.25 mg total) by mouth at bedtime. 90 tablet 0   simvastatin (ZOCOR) 20 MG tablet Take 20 mg by mouth at bedtime.     Vibegron (GEMTESA) 75 MG TABS Take 1 tablet (75 mg total) by mouth daily. 30 tablet 5   Vitamin D, Ergocalciferol, (DRISDOL) 1.25 MG (50000 UNIT) CAPS capsule Take 1 capsule by mouth once a week.       Discharge Medications: Please see discharge summary for a list of discharge medications.  Relevant Imaging Results:  Relevant Lab Results:   Additional Information SSN: [id number] [place]

## 2022-10-05 NOTE — ED Notes (Signed)
Pt has been changed and cleaned up due to pt taking purewick out. Pt has a new purewick in place and resting at this time.

## 2022-10-05 NOTE — Plan of Care (Signed)
  Problem: Acute Rehab PT Goals(only PT should resolve) Goal: Pt Will Go Supine/Side To Sit Outcome: Progressing Flowsheets (Taken 10/05/2022 1203) Pt will go Supine/Side to Sit:  with min guard assist  with minimal assist Goal: Patient Will Transfer Sit To/From Stand Outcome: Progressing Flowsheets (Taken 10/05/2022 1203) Patient will transfer sit to/from stand:  with min guard assist  with minimal assist Goal: Pt Will Transfer Bed To Chair/Chair To Bed Outcome: Progressing Flowsheets (Taken 10/05/2022 1203) Pt will Transfer Bed to Chair/Chair to Bed: with min assist Goal: Pt Will Ambulate Outcome: Progressing Flowsheets (Taken 10/05/2022 1203) Pt will Ambulate:  25 feet  with minimal assist  with rolling walker   12:04 PM, 10/05/22 Lonell Grandchild, MPT Physical Therapist with [facility] [phone] office [phone] mobile phone

## 2022-10-05 NOTE — ED Notes (Signed)
Patient was incontinent of urine. Patient was cleaned. Linens were changed. Patient is laying in bed. Previous purewick removed and new one in use.

## 2022-10-06 DIAGNOSIS — R404 Transient alteration of awareness: Secondary | ICD-10-CM | POA: Diagnosis not present

## 2022-10-06 DIAGNOSIS — E785 Hyperlipidemia, unspecified: Secondary | ICD-10-CM | POA: Diagnosis not present

## 2022-10-06 DIAGNOSIS — M6281 Muscle weakness (generalized): Secondary | ICD-10-CM | POA: Diagnosis not present

## 2022-10-06 DIAGNOSIS — R410 Disorientation, unspecified: Secondary | ICD-10-CM | POA: Diagnosis not present

## 2022-10-06 DIAGNOSIS — N3281 Overactive bladder: Secondary | ICD-10-CM | POA: Diagnosis not present

## 2022-10-06 DIAGNOSIS — Z7982 Long term (current) use of aspirin: Secondary | ICD-10-CM | POA: Diagnosis not present

## 2022-10-06 DIAGNOSIS — E039 Hypothyroidism, unspecified: Secondary | ICD-10-CM | POA: Diagnosis not present

## 2022-10-06 DIAGNOSIS — W19XXXA Unspecified fall, initial encounter: Secondary | ICD-10-CM | POA: Diagnosis not present

## 2022-10-06 DIAGNOSIS — R2689 Other abnormalities of gait and mobility: Secondary | ICD-10-CM | POA: Diagnosis not present

## 2022-10-06 DIAGNOSIS — Z743 Need for continuous supervision: Secondary | ICD-10-CM | POA: Diagnosis not present

## 2022-10-06 DIAGNOSIS — I129 Hypertensive chronic kidney disease with stage 1 through stage 4 chronic kidney disease, or unspecified chronic kidney disease: Secondary | ICD-10-CM | POA: Diagnosis not present

## 2022-10-06 DIAGNOSIS — R58 Hemorrhage, not elsewhere classified: Secondary | ICD-10-CM | POA: Diagnosis not present

## 2022-10-06 DIAGNOSIS — N189 Chronic kidney disease, unspecified: Secondary | ICD-10-CM | POA: Diagnosis not present

## 2022-10-06 DIAGNOSIS — R6889 Other general symptoms and signs: Secondary | ICD-10-CM | POA: Diagnosis not present

## 2022-10-06 DIAGNOSIS — Z20822 Contact with and (suspected) exposure to covid-19: Secondary | ICD-10-CM | POA: Diagnosis not present

## 2022-10-06 DIAGNOSIS — I10 Essential (primary) hypertension: Secondary | ICD-10-CM | POA: Diagnosis not present

## 2022-10-06 DIAGNOSIS — R2681 Unsteadiness on feet: Secondary | ICD-10-CM | POA: Diagnosis not present

## 2022-10-06 DIAGNOSIS — N1831 Chronic kidney disease, stage 3a: Secondary | ICD-10-CM | POA: Diagnosis not present

## 2022-10-06 DIAGNOSIS — M810 Age-related osteoporosis without current pathological fracture: Secondary | ICD-10-CM | POA: Diagnosis not present

## 2022-10-06 DIAGNOSIS — Z79899 Other long term (current) drug therapy: Secondary | ICD-10-CM | POA: Diagnosis not present

## 2022-10-06 DIAGNOSIS — N390 Urinary tract infection, site not specified: Secondary | ICD-10-CM | POA: Diagnosis not present

## 2022-10-06 NOTE — ED Notes (Signed)
Report called to Administrator, Civil Service at [facility]

## 2022-10-06 NOTE — Progress Notes (Addendum)
Auth still pending.  Addend @ 9:16 AM Auth escalated to peer to peer.  Deadline: 10/06/22 at 1:30pm.  Contact Number: [phone], option 5. Will need pts name, DOB and member ID ([id number])   Notified EDP via secure chat.

## 2022-10-06 NOTE — Progress Notes (Signed)
Auth approved. EDP and RN notified. RN to call transport. Pt can arrive after 12pm.

## 2022-10-06 NOTE — ED Provider Notes (Signed)
Emergency Medicine Observation Re-evaluation Note  Amy Roth is a 89 y.o. female, seen on rounds today.  Pt initially presented to the ED for complaints of Altered Mental Status Currently, the patient is resting comfortably.  Physical Exam  BP (!) 132/97   Pulse 93   Temp 97.7 F (36.5 C) (Axillary)   Resp (!) 28   Ht 5\' 1"  (1.549 m)   Wt 64.7 kg   SpO2 (!) 86%   BMI 26.95 kg/m  Physical Exam Vitals and nursing note reviewed.  Constitutional:      General: She is not in acute distress.    Appearance: She is well-developed.  HENT:     Head: Normocephalic and atraumatic.  Eyes:     Conjunctiva/sclera: Conjunctivae normal.  Cardiovascular:     Rate and Rhythm: Normal rate and regular rhythm.     Heart sounds: No murmur heard. Pulmonary:     Effort: Pulmonary effort is normal. No respiratory distress.     Breath sounds: Normal breath sounds.  Abdominal:     Palpations: Abdomen is soft.     Tenderness: There is no abdominal tenderness.  Musculoskeletal:        General: No swelling.     Cervical back: Neck supple.  Skin:    General: Skin is warm and dry.     Capillary Refill: Capillary refill takes less than 2 seconds.  Neurological:     Mental Status: She is alert.  Psychiatric:        Mood and Affect: Mood normal.      ED Course / MDM  EKG:   I have reviewed the labs performed to date as well as medications administered while in observation.  Recent changes in the last 24 hours include prior authorization discussion had between myself and patient's insurance company.  Patient approved for SNF.  Plan  Current plan is for discharge to SNF.    Glendora Score, MD 10/06/22 1131

## 2022-10-08 DIAGNOSIS — W19XXXA Unspecified fall, initial encounter: Secondary | ICD-10-CM | POA: Diagnosis not present

## 2022-10-08 DIAGNOSIS — R6889 Other general symptoms and signs: Secondary | ICD-10-CM | POA: Diagnosis not present

## 2022-10-08 DIAGNOSIS — R58 Hemorrhage, not elsewhere classified: Secondary | ICD-10-CM | POA: Diagnosis not present

## 2022-10-08 DIAGNOSIS — R404 Transient alteration of awareness: Secondary | ICD-10-CM | POA: Diagnosis not present

## 2022-10-08 DIAGNOSIS — Z743 Need for continuous supervision: Secondary | ICD-10-CM | POA: Diagnosis not present

## 2022-10-09 ENCOUNTER — Other Ambulatory Visit: Payer: Self-pay

## 2022-10-09 ENCOUNTER — Emergency Department (HOSPITAL_COMMUNITY): Payer: Medicare Other

## 2022-10-09 ENCOUNTER — Emergency Department (HOSPITAL_COMMUNITY)
Admission: EM | Admit: 2022-10-09 | Discharge: 2022-10-09 | Disposition: A | Discharge status: Skilled Nursing Facility | Payer: Medicare Other | Attending: Emergency Medicine | Admitting: Emergency Medicine

## 2022-10-09 DIAGNOSIS — Z79899 Other long term (current) drug therapy: Secondary | ICD-10-CM | POA: Diagnosis not present

## 2022-10-09 DIAGNOSIS — M810 Age-related osteoporosis without current pathological fracture: Secondary | ICD-10-CM | POA: Diagnosis not present

## 2022-10-09 DIAGNOSIS — Z743 Need for continuous supervision: Secondary | ICD-10-CM | POA: Diagnosis not present

## 2022-10-09 DIAGNOSIS — F0390 Unspecified dementia without behavioral disturbance: Secondary | ICD-10-CM | POA: Diagnosis not present

## 2022-10-09 DIAGNOSIS — I129 Hypertensive chronic kidney disease with stage 1 through stage 4 chronic kidney disease, or unspecified chronic kidney disease: Secondary | ICD-10-CM | POA: Diagnosis not present

## 2022-10-09 DIAGNOSIS — I10 Essential (primary) hypertension: Secondary | ICD-10-CM | POA: Diagnosis not present

## 2022-10-09 DIAGNOSIS — S0083XA Contusion of other part of head, initial encounter: Secondary | ICD-10-CM | POA: Diagnosis not present

## 2022-10-09 DIAGNOSIS — N390 Urinary tract infection, site not specified: Secondary | ICD-10-CM | POA: Diagnosis not present

## 2022-10-09 DIAGNOSIS — S0003XA Contusion of scalp, initial encounter: Secondary | ICD-10-CM | POA: Insufficient documentation

## 2022-10-09 DIAGNOSIS — Z7982 Long term (current) use of aspirin: Secondary | ICD-10-CM | POA: Diagnosis not present

## 2022-10-09 DIAGNOSIS — W19XXXA Unspecified fall, initial encounter: Secondary | ICD-10-CM | POA: Insufficient documentation

## 2022-10-09 DIAGNOSIS — Z880 Allergy status to penicillin: Secondary | ICD-10-CM | POA: Diagnosis not present

## 2022-10-09 DIAGNOSIS — S0990XA Unspecified injury of head, initial encounter: Secondary | ICD-10-CM | POA: Diagnosis not present

## 2022-10-09 DIAGNOSIS — E785 Hyperlipidemia, unspecified: Secondary | ICD-10-CM | POA: Diagnosis not present

## 2022-10-09 DIAGNOSIS — N189 Chronic kidney disease, unspecified: Secondary | ICD-10-CM | POA: Diagnosis not present

## 2022-10-09 DIAGNOSIS — K219 Gastro-esophageal reflux disease without esophagitis: Secondary | ICD-10-CM | POA: Diagnosis not present

## 2022-10-09 DIAGNOSIS — Y92129 Unspecified place in nursing home as the place of occurrence of the external cause: Secondary | ICD-10-CM | POA: Insufficient documentation

## 2022-10-09 DIAGNOSIS — N3281 Overactive bladder: Secondary | ICD-10-CM | POA: Diagnosis not present

## 2022-10-09 DIAGNOSIS — E43 Unspecified severe protein-calorie malnutrition: Secondary | ICD-10-CM | POA: Diagnosis not present

## 2022-10-09 DIAGNOSIS — Z7401 Bed confinement status: Secondary | ICD-10-CM | POA: Diagnosis not present

## 2022-10-09 DIAGNOSIS — M6281 Muscle weakness (generalized): Secondary | ICD-10-CM | POA: Diagnosis not present

## 2022-10-09 DIAGNOSIS — R6889 Other general symptoms and signs: Secondary | ICD-10-CM | POA: Diagnosis not present

## 2022-10-09 DIAGNOSIS — R296 Repeated falls: Secondary | ICD-10-CM | POA: Diagnosis not present

## 2022-10-09 DIAGNOSIS — G2581 Restless legs syndrome: Secondary | ICD-10-CM | POA: Diagnosis not present

## 2022-10-09 DIAGNOSIS — W01198A Fall on same level from slipping, tripping and stumbling with subsequent striking against other object, initial encounter: Secondary | ICD-10-CM | POA: Diagnosis not present

## 2022-10-09 DIAGNOSIS — E039 Hypothyroidism, unspecified: Secondary | ICD-10-CM | POA: Diagnosis not present

## 2022-10-09 DIAGNOSIS — R404 Transient alteration of awareness: Secondary | ICD-10-CM | POA: Diagnosis not present

## 2022-10-09 DIAGNOSIS — S199XXA Unspecified injury of neck, initial encounter: Secondary | ICD-10-CM | POA: Diagnosis not present

## 2022-10-09 LAB — CULTURE, BLOOD (ROUTINE X 2)
Culture: NO GROWTH
Culture: NO GROWTH
Special Requests: ADEQUATE

## 2022-10-09 LAB — URINE CULTURE

## 2022-10-09 MED ORDER — HYDROXYZINE HCL 25 MG PO TABS
25.0000 mg | ORAL_TABLET | Freq: Once | ORAL | Status: AC
  Administered 2022-10-09: 25 mg via ORAL
  Filled 2022-10-09: qty 1

## 2022-10-09 NOTE — ED Notes (Signed)
Pt cleaned, new brief, gown and linen placed.

## 2022-10-09 NOTE — ED Triage Notes (Signed)
Pt arrives via [facility] from [facility] c/o unwitnessed fall. Hematoma noted to right side of head. C-collar placed PTA. Does not take blood thinners.  Per EMS pt has hx of dementia and is at baseline.    EMS states pts paperwork states she is a DNR but facility did not provider DNR paperwork.

## 2022-10-09 NOTE — ED Provider Notes (Signed)
[place] EMERGENCY DEPARTMENT AT [facility]  Provider Note  CSN: [id number] Arrival date & time: 10/09/22 0025  History Chief Complaint  Patient presents with   Amy Roth is a 89 y.o. female brought to the ED via EMS from [facility] for unwitnessed fall. Patient's daughter in law is at bedside. She reports patient has a history of dementia, has had a relatively rapid decline in recent weeks. Was in the ED on 4/3 for agitation and possible UTI. Lab and imaging workup then was overall negative and she was ultimately held in the ED and placed in SNF on 4/5. She has only been there for a few days. Found on the floor next to her bed tonight. Patient is unable to give any further history.   Home Medications Prior to Admission medications   Medication Sig Start Date End Date Taking? Authorizing Provider  amLODipine (NORVASC) 5 MG tablet Take 0.5 tablets (2.5 mg total) by mouth daily. 04/29/22   Vassie Loll, MD  aspirin 81 MG tablet Take 81 mg by mouth daily.    [provider]  benazepril (LOTENSIN) 5 MG tablet Take 5 mg by mouth daily. 02/15/22   [provider]  cephALEXin (KEFLEX) 250 MG capsule Take 1 capsule (250 mg total) by mouth daily. 08/26/22   Miki Kins, FNP  docusate sodium (COLACE) 100 MG capsule Take 100 mg by mouth 2 (two) times daily as needed for mild constipation.    [provider]  levothyroxine (SYNTHROID) 75 MCG tablet Take 1 tablet (75 mcg total) by mouth daily before breakfast. 10/24/20   Elgergawy, Leana Roe, MD  meloxicam (MOBIC) 15 MG tablet Take 1 tablet (15 mg total) by mouth daily. 05/03/22   Vassie Loll, MD  metoprolol succinate (TOPROL-XL) 25 MG 24 hr tablet Take 25 mg by mouth daily.    [provider]  pantoprazole (PROTONIX) 40 MG tablet Take 1 tablet (40 mg total) by mouth 2 (two) times daily. After one month, can take medication once a day only. 04/29/22   Vassie Loll, MD   PARoxetine (PAXIL) 30 MG tablet Take 30 mg by mouth daily. 03/23/22   [provider]  QUEtiapine (SEROQUEL) 50 MG tablet Take 1 tablet (50 mg total) by mouth at bedtime. 10/04/22   Lonell Grandchild, MD  rOPINIRole (REQUIP) 0.25 MG tablet Take 1 tablet (0.25 mg total) by mouth at bedtime. 09/19/22   Miki Kins, FNP  simvastatin (ZOCOR) 20 MG tablet Take 20 mg by mouth at bedtime.    [provider]  Vibegron (GEMTESA) 75 MG TABS Take 1 tablet (75 mg total) by mouth daily. 09/19/22   Miki Kins, FNP  Vitamin D, Ergocalciferol, (DRISDOL) 1.25 MG (50000 UNIT) CAPS capsule Take 1 capsule by mouth once a week. 08/10/20   [provider]     Allergies    Nitrofurantoin macrocrystal, Penicillins, and Penicillins   Review of Systems   Review of Systems Please see HPI for pertinent positives and negatives  Physical Exam BP (!) 142/95   Pulse (!) 102   Temp 98 F (36.7 C)   Resp 16   Ht 5\' 1"  (1.549 m)   Wt 64.7 kg   SpO2 100%   BMI 26.95 kg/m   Physical Exam Vitals and nursing note reviewed.  Constitutional:      Appearance: Normal appearance.  HENT:     Head: Normocephalic.     Comments: Hematoma  R temporal scalp    Nose: Nose normal.     Mouth/Throat:     Mouth: Mucous membranes are moist.  Eyes:     Extraocular Movements: Extraocular movements intact.     Conjunctiva/sclera: Conjunctivae normal.  Cardiovascular:     Rate and Rhythm: Normal rate.  Pulmonary:     Effort: Pulmonary effort is normal.     Breath sounds: Normal breath sounds.  Abdominal:     General: Abdomen is flat.     Palpations: Abdomen is soft.     Tenderness: There is no abdominal tenderness.  Musculoskeletal:        General: No swelling, tenderness or deformity. Normal range of motion.     Cervical back: Neck supple. No tenderness.  Skin:    General: Skin is warm and dry.  Neurological:     General: No focal deficit present.     Mental Status: She is alert.  She is disoriented.     Cranial Nerves: No cranial nerve deficit.     Motor: No weakness.  Psychiatric:        Mood and Affect: Mood normal.     ED Results / Procedures / Treatments   EKG None  Procedures Procedures  Medications Ordered in the ED Medications  hydrOXYzine (ATARAX) tablet 25 mg (has no administration in time range)    Initial Impression and Plan  Patient here with unwitnessed fall and head injury. She had a comprehensive medical workup in the last week which was negative. Will check head/C-spine CT and reassess.   ED Course   Clinical Course as of 10/09/22 0252  Mon Oct 09, 2022  0251 I personally viewed the images from radiology studies and agree with radiologist interpretation: CT is neg for intracranial process or spine injury. Plan to return to SNF. Family at bedside is amenable. Hydroxyzine for restlessness while awaiting ride back.  [CS]    Clinical Course User Index [CS] Pollyann Savoy, MD     MDM Rules/Calculators/A&P Medical Decision Making Problems Addressed: Fall, initial encounter: acute illness or injury Injury of head, initial encounter: acute illness or injury  Amount and/or Complexity of Data Reviewed Radiology: ordered and independent interpretation performed. Decision-making details documented in ED Course.  Risk Prescription drug management.     Final Clinical Impression(s) / ED Diagnoses Final diagnoses:  Fall, initial encounter  Injury of head, initial encounter    Rx / DC Orders ED Discharge Orders     None        Pollyann Savoy, MD 10/09/22 [phone]

## 2022-10-09 NOTE — ED Notes (Signed)
Notified [facility] of patient in need of transportation back to [facility].

## 2022-10-09 NOTE — ED Notes (Addendum)
Pt attempting to remove c-collar.pt redirected

## 2022-10-11 DIAGNOSIS — R296 Repeated falls: Secondary | ICD-10-CM | POA: Diagnosis not present

## 2022-10-16 DIAGNOSIS — W19XXXA Unspecified fall, initial encounter: Secondary | ICD-10-CM | POA: Diagnosis not present

## 2022-10-16 DIAGNOSIS — E785 Hyperlipidemia, unspecified: Secondary | ICD-10-CM | POA: Diagnosis not present

## 2022-10-16 DIAGNOSIS — Z743 Need for continuous supervision: Secondary | ICD-10-CM | POA: Diagnosis not present

## 2022-10-16 DIAGNOSIS — S0083XA Contusion of other part of head, initial encounter: Secondary | ICD-10-CM | POA: Diagnosis not present

## 2022-10-16 DIAGNOSIS — I129 Hypertensive chronic kidney disease with stage 1 through stage 4 chronic kidney disease, or unspecified chronic kidney disease: Secondary | ICD-10-CM | POA: Diagnosis not present

## 2022-10-16 DIAGNOSIS — N189 Chronic kidney disease, unspecified: Secondary | ICD-10-CM | POA: Diagnosis not present

## 2022-10-16 DIAGNOSIS — R404 Transient alteration of awareness: Secondary | ICD-10-CM | POA: Diagnosis not present

## 2022-10-16 DIAGNOSIS — Z880 Allergy status to penicillin: Secondary | ICD-10-CM | POA: Diagnosis not present

## 2022-10-16 DIAGNOSIS — S0990XA Unspecified injury of head, initial encounter: Secondary | ICD-10-CM | POA: Diagnosis not present

## 2022-10-16 DIAGNOSIS — E039 Hypothyroidism, unspecified: Secondary | ICD-10-CM | POA: Diagnosis not present

## 2022-10-16 DIAGNOSIS — S0003XA Contusion of scalp, initial encounter: Secondary | ICD-10-CM | POA: Diagnosis not present

## 2022-10-16 DIAGNOSIS — W01198A Fall on same level from slipping, tripping and stumbling with subsequent striking against other object, initial encounter: Secondary | ICD-10-CM | POA: Diagnosis not present

## 2022-10-17 DIAGNOSIS — N189 Chronic kidney disease, unspecified: Secondary | ICD-10-CM | POA: Diagnosis not present

## 2022-10-17 DIAGNOSIS — R0602 Shortness of breath: Secondary | ICD-10-CM | POA: Diagnosis not present

## 2022-10-17 DIAGNOSIS — N390 Urinary tract infection, site not specified: Secondary | ICD-10-CM | POA: Diagnosis not present

## 2022-10-17 DIAGNOSIS — D649 Anemia, unspecified: Secondary | ICD-10-CM | POA: Diagnosis not present

## 2022-10-17 DIAGNOSIS — Z79899 Other long term (current) drug therapy: Secondary | ICD-10-CM | POA: Diagnosis not present

## 2022-10-17 DIAGNOSIS — R0682 Tachypnea, not elsewhere classified: Secondary | ICD-10-CM | POA: Diagnosis not present

## 2022-10-17 DIAGNOSIS — N3281 Overactive bladder: Secondary | ICD-10-CM | POA: Diagnosis not present

## 2022-10-17 DIAGNOSIS — R404 Transient alteration of awareness: Secondary | ICD-10-CM | POA: Diagnosis not present

## 2022-10-17 DIAGNOSIS — R579 Shock, unspecified: Secondary | ICD-10-CM | POA: Diagnosis not present

## 2022-10-17 DIAGNOSIS — Z743 Need for continuous supervision: Secondary | ICD-10-CM | POA: Diagnosis not present

## 2022-10-17 DIAGNOSIS — R0603 Acute respiratory distress: Secondary | ICD-10-CM | POA: Diagnosis not present

## 2022-10-17 DIAGNOSIS — S51811A Laceration without foreign body of right forearm, initial encounter: Secondary | ICD-10-CM | POA: Diagnosis not present

## 2022-10-17 DIAGNOSIS — M6281 Muscle weakness (generalized): Secondary | ICD-10-CM | POA: Diagnosis not present

## 2022-10-17 DIAGNOSIS — I129 Hypertensive chronic kidney disease with stage 1 through stage 4 chronic kidney disease, or unspecified chronic kidney disease: Secondary | ICD-10-CM | POA: Diagnosis not present

## 2022-10-17 DIAGNOSIS — Z8673 Personal history of transient ischemic attack (TIA), and cerebral infarction without residual deficits: Secondary | ICD-10-CM | POA: Diagnosis not present

## 2022-10-17 DIAGNOSIS — F0393 Unspecified dementia, unspecified severity, with mood disturbance: Secondary | ICD-10-CM | POA: Diagnosis not present

## 2022-10-17 DIAGNOSIS — R296 Repeated falls: Secondary | ICD-10-CM | POA: Diagnosis not present

## 2022-10-17 DIAGNOSIS — E860 Dehydration: Secondary | ICD-10-CM | POA: Diagnosis not present

## 2022-10-17 DIAGNOSIS — G9341 Metabolic encephalopathy: Secondary | ICD-10-CM | POA: Diagnosis not present

## 2022-10-17 DIAGNOSIS — E559 Vitamin D deficiency, unspecified: Secondary | ICD-10-CM | POA: Diagnosis not present

## 2022-10-17 DIAGNOSIS — S01312A Laceration without foreign body of left ear, initial encounter: Secondary | ICD-10-CM | POA: Diagnosis not present

## 2022-10-17 DIAGNOSIS — R55 Syncope and collapse: Secondary | ICD-10-CM | POA: Diagnosis not present

## 2022-10-17 DIAGNOSIS — Z791 Long term (current) use of non-steroidal anti-inflammatories (NSAID): Secondary | ICD-10-CM | POA: Diagnosis not present

## 2022-10-17 DIAGNOSIS — Z79890 Hormone replacement therapy (postmenopausal): Secondary | ICD-10-CM | POA: Diagnosis not present

## 2022-10-17 DIAGNOSIS — S0990XA Unspecified injury of head, initial encounter: Secondary | ICD-10-CM | POA: Diagnosis not present

## 2022-10-17 DIAGNOSIS — K625 Hemorrhage of anus and rectum: Secondary | ICD-10-CM | POA: Diagnosis not present

## 2022-10-17 DIAGNOSIS — I10 Essential (primary) hypertension: Secondary | ICD-10-CM | POA: Diagnosis not present

## 2022-10-17 DIAGNOSIS — Z1152 Encounter for screening for COVID-19: Secondary | ICD-10-CM | POA: Diagnosis not present

## 2022-10-17 DIAGNOSIS — R400 Somnolence: Secondary | ICD-10-CM | POA: Diagnosis not present

## 2022-10-17 DIAGNOSIS — I959 Hypotension, unspecified: Secondary | ICD-10-CM | POA: Diagnosis not present

## 2022-10-17 DIAGNOSIS — Z7982 Long term (current) use of aspirin: Secondary | ICD-10-CM | POA: Diagnosis not present

## 2022-10-17 DIAGNOSIS — K219 Gastro-esophageal reflux disease without esophagitis: Secondary | ICD-10-CM | POA: Diagnosis not present

## 2022-10-17 DIAGNOSIS — R52 Pain, unspecified: Secondary | ICD-10-CM | POA: Diagnosis not present

## 2022-10-17 DIAGNOSIS — I499 Cardiac arrhythmia, unspecified: Secondary | ICD-10-CM | POA: Diagnosis not present

## 2022-10-17 DIAGNOSIS — M1990 Unspecified osteoarthritis, unspecified site: Secondary | ICD-10-CM | POA: Diagnosis not present

## 2022-10-17 DIAGNOSIS — R6889 Other general symptoms and signs: Secondary | ICD-10-CM | POA: Diagnosis not present

## 2022-10-17 DIAGNOSIS — F0394 Unspecified dementia, unspecified severity, with anxiety: Secondary | ICD-10-CM | POA: Diagnosis not present

## 2022-10-17 DIAGNOSIS — F03918 Unspecified dementia, unspecified severity, with other behavioral disturbance: Secondary | ICD-10-CM | POA: Diagnosis not present

## 2022-10-17 DIAGNOSIS — E039 Hypothyroidism, unspecified: Secondary | ICD-10-CM | POA: Diagnosis not present

## 2022-10-17 DIAGNOSIS — Z7401 Bed confinement status: Secondary | ICD-10-CM | POA: Diagnosis not present

## 2022-10-17 DIAGNOSIS — Z66 Do not resuscitate: Secondary | ICD-10-CM | POA: Diagnosis not present

## 2022-10-17 DIAGNOSIS — J9601 Acute respiratory failure with hypoxia: Secondary | ICD-10-CM | POA: Diagnosis not present

## 2022-10-17 DIAGNOSIS — Z880 Allergy status to penicillin: Secondary | ICD-10-CM | POA: Diagnosis not present

## 2022-10-17 DIAGNOSIS — F32A Depression, unspecified: Secondary | ICD-10-CM | POA: Diagnosis not present

## 2022-10-17 DIAGNOSIS — R001 Bradycardia, unspecified: Secondary | ICD-10-CM | POA: Diagnosis not present

## 2022-10-17 DIAGNOSIS — Z9049 Acquired absence of other specified parts of digestive tract: Secondary | ICD-10-CM | POA: Diagnosis not present

## 2022-10-17 DIAGNOSIS — S61412A Laceration without foreign body of left hand, initial encounter: Secondary | ICD-10-CM | POA: Diagnosis not present

## 2022-10-17 DIAGNOSIS — Z515 Encounter for palliative care: Secondary | ICD-10-CM | POA: Diagnosis not present

## 2022-10-17 DIAGNOSIS — Z86718 Personal history of other venous thrombosis and embolism: Secondary | ICD-10-CM | POA: Diagnosis not present

## 2022-10-17 DIAGNOSIS — E785 Hyperlipidemia, unspecified: Secondary | ICD-10-CM | POA: Diagnosis not present

## 2022-10-17 DIAGNOSIS — K921 Melena: Secondary | ICD-10-CM | POA: Diagnosis not present

## 2022-10-18 DIAGNOSIS — F03918 Unspecified dementia, unspecified severity, with other behavioral disturbance: Secondary | ICD-10-CM | POA: Diagnosis not present

## 2022-10-18 DIAGNOSIS — R296 Repeated falls: Secondary | ICD-10-CM | POA: Diagnosis not present

## 2022-10-20 DIAGNOSIS — F03918 Unspecified dementia, unspecified severity, with other behavioral disturbance: Secondary | ICD-10-CM | POA: Diagnosis not present

## 2022-10-20 DIAGNOSIS — S61412A Laceration without foreign body of left hand, initial encounter: Secondary | ICD-10-CM | POA: Diagnosis not present

## 2022-10-20 DIAGNOSIS — R296 Repeated falls: Secondary | ICD-10-CM | POA: Diagnosis not present

## 2022-10-22 DIAGNOSIS — K921 Melena: Secondary | ICD-10-CM | POA: Diagnosis not present

## 2022-10-23 DIAGNOSIS — D649 Anemia, unspecified: Secondary | ICD-10-CM | POA: Diagnosis not present

## 2022-10-23 DIAGNOSIS — I10 Essential (primary) hypertension: Secondary | ICD-10-CM | POA: Diagnosis not present

## 2022-10-23 DIAGNOSIS — E559 Vitamin D deficiency, unspecified: Secondary | ICD-10-CM | POA: Diagnosis not present

## 2022-10-25 DIAGNOSIS — R52 Pain, unspecified: Secondary | ICD-10-CM | POA: Diagnosis not present

## 2022-10-25 DIAGNOSIS — F03918 Unspecified dementia, unspecified severity, with other behavioral disturbance: Secondary | ICD-10-CM | POA: Diagnosis not present

## 2022-10-25 DIAGNOSIS — R296 Repeated falls: Secondary | ICD-10-CM | POA: Diagnosis not present

## 2022-10-25 DIAGNOSIS — E860 Dehydration: Secondary | ICD-10-CM | POA: Diagnosis not present

## 2022-10-26 DIAGNOSIS — N390 Urinary tract infection, site not specified: Secondary | ICD-10-CM | POA: Diagnosis not present

## 2022-10-27 DIAGNOSIS — S51811A Laceration without foreign body of right forearm, initial encounter: Secondary | ICD-10-CM | POA: Diagnosis not present

## 2022-10-30 ENCOUNTER — Telehealth: Payer: Self-pay

## 2022-11-01 DIAGNOSIS — R296 Repeated falls: Secondary | ICD-10-CM | POA: Diagnosis not present

## 2022-11-01 DIAGNOSIS — S01312A Laceration without foreign body of left ear, initial encounter: Secondary | ICD-10-CM | POA: Diagnosis not present

## 2022-11-01 DIAGNOSIS — F03918 Unspecified dementia, unspecified severity, with other behavioral disturbance: Secondary | ICD-10-CM | POA: Diagnosis not present

## 2022-11-02 ENCOUNTER — Emergency Department (HOSPITAL_COMMUNITY): Payer: Medicare Other

## 2022-11-02 ENCOUNTER — Other Ambulatory Visit: Payer: Self-pay

## 2022-11-02 ENCOUNTER — Encounter (HOSPITAL_COMMUNITY): Payer: Self-pay | Admitting: Emergency Medicine

## 2022-11-02 ENCOUNTER — Inpatient Hospital Stay (HOSPITAL_COMMUNITY)
Admission: EM | Admit: 2022-11-02 | Discharge: 2022-12-02 | DRG: 951 | Disposition: E | Payer: Medicare Other | Source: Skilled Nursing Facility | Attending: Family Medicine | Admitting: Family Medicine

## 2022-11-02 DIAGNOSIS — F0394 Unspecified dementia, unspecified severity, with anxiety: Secondary | ICD-10-CM | POA: Diagnosis present

## 2022-11-02 DIAGNOSIS — R0602 Shortness of breath: Secondary | ICD-10-CM | POA: Diagnosis not present

## 2022-11-02 DIAGNOSIS — I10 Essential (primary) hypertension: Secondary | ICD-10-CM | POA: Diagnosis not present

## 2022-11-02 DIAGNOSIS — I959 Hypotension, unspecified: Secondary | ICD-10-CM | POA: Diagnosis present

## 2022-11-02 DIAGNOSIS — K219 Gastro-esophageal reflux disease without esophagitis: Secondary | ICD-10-CM | POA: Diagnosis not present

## 2022-11-02 DIAGNOSIS — R001 Bradycardia, unspecified: Secondary | ICD-10-CM | POA: Diagnosis present

## 2022-11-02 DIAGNOSIS — G9341 Metabolic encephalopathy: Secondary | ICD-10-CM | POA: Diagnosis not present

## 2022-11-02 DIAGNOSIS — Z743 Need for continuous supervision: Secondary | ICD-10-CM | POA: Diagnosis not present

## 2022-11-02 DIAGNOSIS — R404 Transient alteration of awareness: Secondary | ICD-10-CM | POA: Diagnosis not present

## 2022-11-02 DIAGNOSIS — Z66 Do not resuscitate: Secondary | ICD-10-CM | POA: Diagnosis not present

## 2022-11-02 DIAGNOSIS — F03918 Unspecified dementia, unspecified severity, with other behavioral disturbance: Secondary | ICD-10-CM | POA: Diagnosis not present

## 2022-11-02 DIAGNOSIS — J9601 Acute respiratory failure with hypoxia: Secondary | ICD-10-CM | POA: Diagnosis not present

## 2022-11-02 DIAGNOSIS — K921 Melena: Secondary | ICD-10-CM | POA: Diagnosis not present

## 2022-11-02 DIAGNOSIS — I499 Cardiac arrhythmia, unspecified: Secondary | ICD-10-CM | POA: Diagnosis not present

## 2022-11-02 DIAGNOSIS — Z515 Encounter for palliative care: Secondary | ICD-10-CM | POA: Diagnosis not present

## 2022-11-02 DIAGNOSIS — M1990 Unspecified osteoarthritis, unspecified site: Secondary | ICD-10-CM | POA: Diagnosis present

## 2022-11-02 DIAGNOSIS — R0603 Acute respiratory distress: Secondary | ICD-10-CM | POA: Diagnosis not present

## 2022-11-02 DIAGNOSIS — Z79899 Other long term (current) drug therapy: Secondary | ICD-10-CM

## 2022-11-02 DIAGNOSIS — Z79890 Hormone replacement therapy (postmenopausal): Secondary | ICD-10-CM

## 2022-11-02 DIAGNOSIS — R579 Shock, unspecified: Secondary | ICD-10-CM | POA: Diagnosis not present

## 2022-11-02 DIAGNOSIS — Z880 Allergy status to penicillin: Secondary | ICD-10-CM

## 2022-11-02 DIAGNOSIS — Z86718 Personal history of other venous thrombosis and embolism: Secondary | ICD-10-CM | POA: Diagnosis not present

## 2022-11-02 DIAGNOSIS — R296 Repeated falls: Secondary | ICD-10-CM | POA: Diagnosis not present

## 2022-11-02 DIAGNOSIS — E785 Hyperlipidemia, unspecified: Secondary | ICD-10-CM | POA: Diagnosis not present

## 2022-11-02 DIAGNOSIS — F32A Depression, unspecified: Secondary | ICD-10-CM | POA: Diagnosis present

## 2022-11-02 DIAGNOSIS — Z9049 Acquired absence of other specified parts of digestive tract: Secondary | ICD-10-CM

## 2022-11-02 DIAGNOSIS — Z7982 Long term (current) use of aspirin: Secondary | ICD-10-CM | POA: Diagnosis not present

## 2022-11-02 DIAGNOSIS — K625 Hemorrhage of anus and rectum: Secondary | ICD-10-CM | POA: Diagnosis not present

## 2022-11-02 DIAGNOSIS — R55 Syncope and collapse: Secondary | ICD-10-CM | POA: Diagnosis not present

## 2022-11-02 DIAGNOSIS — R0682 Tachypnea, not elsewhere classified: Secondary | ICD-10-CM | POA: Diagnosis present

## 2022-11-02 DIAGNOSIS — E039 Hypothyroidism, unspecified: Secondary | ICD-10-CM | POA: Diagnosis not present

## 2022-11-02 DIAGNOSIS — R4182 Altered mental status, unspecified: Secondary | ICD-10-CM | POA: Diagnosis present

## 2022-11-02 DIAGNOSIS — R6889 Other general symptoms and signs: Secondary | ICD-10-CM | POA: Diagnosis not present

## 2022-11-02 DIAGNOSIS — Z8673 Personal history of transient ischemic attack (TIA), and cerebral infarction without residual deficits: Secondary | ICD-10-CM | POA: Diagnosis not present

## 2022-11-02 DIAGNOSIS — R400 Somnolence: Secondary | ICD-10-CM | POA: Diagnosis not present

## 2022-11-02 DIAGNOSIS — F0393 Unspecified dementia, unspecified severity, with mood disturbance: Secondary | ICD-10-CM | POA: Diagnosis not present

## 2022-11-02 DIAGNOSIS — Z1152 Encounter for screening for COVID-19: Secondary | ICD-10-CM

## 2022-11-02 DIAGNOSIS — Z888 Allergy status to other drugs, medicaments and biological substances status: Secondary | ICD-10-CM

## 2022-11-02 DIAGNOSIS — Z791 Long term (current) use of non-steroidal anti-inflammatories (NSAID): Secondary | ICD-10-CM | POA: Diagnosis not present

## 2022-11-02 DIAGNOSIS — Z8249 Family history of ischemic heart disease and other diseases of the circulatory system: Secondary | ICD-10-CM

## 2022-11-02 LAB — RESP PANEL BY RT-PCR (RSV, FLU A&B, COVID)  RVPGX2
Influenza A by PCR: NEGATIVE
Influenza B by PCR: NEGATIVE
Resp Syncytial Virus by PCR: NEGATIVE
SARS Coronavirus 2 by RT PCR: NEGATIVE

## 2022-11-02 LAB — CBG MONITORING, ED: Glucose-Capillary: 51 mg/dL — ABNORMAL LOW (ref 70–99)

## 2022-11-02 MED ORDER — LACTATED RINGERS IV SOLN: INTRAVENOUS | Status: DC

## 2022-11-02 MED ORDER — VANCOMYCIN HCL IN DEXTROSE 1-5 GM/200ML-% IV SOLN: 1000.0000 mg | Freq: Once | INTRAVENOUS | Status: DC

## 2022-11-02 MED ORDER — SODIUM CHLORIDE 0.9 % IV BOLUS: 2000.0000 mL | Freq: Once | INTRAVENOUS | Status: DC

## 2022-11-02 MED ORDER — METRONIDAZOLE 500 MG/100ML IV SOLN: 500.0000 mg | Freq: Once | INTRAVENOUS | Status: DC

## 2022-11-02 MED ORDER — SODIUM CHLORIDE 0.9 % IV SOLN: 2.0000 g | Freq: Once | INTRAVENOUS | Status: DC

## 2022-11-02 MED ORDER — DEXTROSE 50 % IV SOLN
INTRAVENOUS | Status: AC
  Filled 2022-11-02: qty 50

## 2022-11-02 NOTE — ED Provider Notes (Signed)
11:47 PM Assumed care from Dr. Estell Harpin, please see their note for full history, physical and decision making until this point. In brief this is a 89 y.o. year old female who presented to the ED tonight with Respiratory Distress     Here from nursing facility for decreased mental status and found to have agonal breathing. Is DNR. Family at bedside. Comfort care. Will admit if needed. Patient continuing to hang on with hypotension. Further goals of care discussions with family, will initiate symptomatic treatments.   Reevaluation and patient is still unresponsive with tachypnea, otherwise VS WNL. Will continue to monitor.   Will admit patient to hospital for further management.   CRITICAL CARE Performed by: Marily Memos Total critical care time: 35 minutes Critical care time was exclusive of separately billable procedures and treating other patients. Critical care was necessary to treat or prevent imminent or life-threatening deterioration. Critical care was time spent personally by me on the following activities: development of treatment plan with patient and/or surrogate as well as nursing, discussions with consultants, evaluation of patient's response to treatment, examination of patient, obtaining history from patient or surrogate, ordering and performing treatments and interventions, ordering and review of laboratory studies, ordering and review of radiographic studies, pulse oximetry and re-evaluation of patient's condition.   Discharge instructions, including strict return precautions for new or worsening symptoms, given. Patient and/or family verbalized understanding and agreement with the plan as described.   Labs, studies and imaging reviewed by myself and considered in medical decision making if ordered. Imaging interpreted by radiology.  Labs Reviewed  CBG MONITORING, ED - Abnormal; Notable for the following components:      Result Value   Glucose-Capillary 51 (*)    All other  components within normal limits  RESP PANEL BY RT-PCR (RSV, FLU A&B, COVID)  RVPGX2    No orders to display    No follow-ups on file.    Tacuma Graffam, Barbara Cower, MD 11/19/2022 [phone]

## 2022-11-02 NOTE — ED Triage Notes (Signed)
Per nursing facility pt has been agonal breathing for a couple of hours with systolic of 60s. Pt is a DNR.

## 2022-11-02 NOTE — ED Notes (Signed)
Family at bedside, chairs provided, tissues provided, spiritual support given to family, Juliette Alcide, [facility] called and notified of situation- family comfort cart requested from [facility].

## 2022-11-02 NOTE — ED Provider Notes (Addendum)
Patient had agonal breaths when she arrived.  She is a DNR.  I spoke with the family and they  decided to make the patient comfort care only.   Bethann Berkshire, MD 11/02/22 2250    Bethann Berkshire, MD 11/02/22 Criss Rosales    Bethann Berkshire, MD 11/02/22 2348

## 2022-11-02 NOTE — Sepsis Progress Note (Signed)
Elink monitoring for the code sepsis protocol.  

## 2022-11-02 NOTE — ED Notes (Signed)
Dr Estell Harpin  spoke with family- pt is now comfort care- orders will be discontinued, monitor set for comfort care -

## 2022-11-02 NOTE — ED Provider Notes (Signed)
[place] EMERGENCY DEPARTMENT AT [facility] Provider Note   CSN: [id number] Arrival date & time: 11/30/2022  2200     History  Chief Complaint  Patient presents with   Respiratory Distress    Amy Roth is a 89 y.o. female.  Patient has a history of a stroke, kidney disease, and dementia.  She is a DNR.  According to the nursing home she has been unresponsive for a number of hours today.  They thought she was a hospice patient until they spoke to the family.  The family decided to send her to the emergency department  The history is provided by the nursing home and the EMS personnel. The history is limited by the condition of the patient.  Shortness of Breath Severity:  Moderate Timing:  Constant Progression:  Waxing and waning Chronicity:  New Context: not activity   Relieved by:  Nothing Worsened by:  Nothing Ineffective treatments:  None tried      Home Medications Prior to Admission medications   Medication Sig Start Date End Date Taking? Authorizing Provider  amLODipine (NORVASC) 5 MG tablet Take 0.5 tablets (2.5 mg total) by mouth daily. 04/29/22   Vassie Loll, MD  aspirin 81 MG tablet Take 81 mg by mouth daily.    [provider]  benazepril (LOTENSIN) 5 MG tablet Take 5 mg by mouth daily. 02/15/22   [provider]  cephALEXin (KEFLEX) 250 MG capsule Take 1 capsule (250 mg total) by mouth daily. 08/26/22   Miki Kins, FNP  docusate sodium (COLACE) 100 MG capsule Take 100 mg by mouth 2 (two) times daily as needed for mild constipation.    [provider]  levothyroxine (SYNTHROID) 75 MCG tablet Take 1 tablet (75 mcg total) by mouth daily before breakfast. 10/24/20   Elgergawy, Leana Roe, MD  meloxicam (MOBIC) 15 MG tablet Take 1 tablet (15 mg total) by mouth daily. 05/03/22   Vassie Loll, MD  metoprolol succinate (TOPROL-XL) 25 MG 24 hr tablet Take 25 mg by mouth daily.    [provider]  pantoprazole  (PROTONIX) 40 MG tablet Take 1 tablet (40 mg total) by mouth 2 (two) times daily. After one month, can take medication once a day only. 04/29/22   Vassie Loll, MD  PARoxetine (PAXIL) 30 MG tablet Take 30 mg by mouth daily. 03/23/22   [provider]  QUEtiapine (SEROQUEL) 50 MG tablet Take 1 tablet (50 mg total) by mouth at bedtime. 10/04/22   Lonell Grandchild, MD  rOPINIRole (REQUIP) 0.25 MG tablet Take 1 tablet (0.25 mg total) by mouth at bedtime. 09/19/22   Miki Kins, FNP  simvastatin (ZOCOR) 20 MG tablet Take 20 mg by mouth at bedtime.    [provider]  Vibegron (GEMTESA) 75 MG TABS Take 1 tablet (75 mg total) by mouth daily. 09/19/22   Miki Kins, FNP  Vitamin D, Ergocalciferol, (DRISDOL) 1.25 MG (50000 UNIT) CAPS capsule Take 1 capsule by mouth once a week. 08/10/20   [provider]      Allergies    Nitrofurantoin macrocrystal, Penicillins, and Penicillins    Review of Systems   Review of Systems  Unable to perform ROS: Mental status change  Respiratory:  Positive for shortness of breath.     Physical Exam Updated Vital Signs BP (!) 47/33   Pulse (!) 57   Temp 98 F (36.7 C) (Rectal)   Resp (!) 22   Ht 5\' 1"  (1.549  m)   Wt 65 kg   SpO2 100%   BMI 27.08 kg/m  Physical Exam Vitals and nursing note reviewed.  Constitutional:      Appearance: She is well-developed. She is toxic-appearing.  HENT:     Head: Normocephalic.     Nose: Nose normal.  Eyes:     General: No scleral icterus.    Conjunctiva/sclera: Conjunctivae normal.  Neck:     Thyroid: No thyromegaly.  Cardiovascular:     Rate and Rhythm: Normal rate and regular rhythm.     Heart sounds: No murmur heard.    No friction rub. No gallop.     Comments: Patient very hypotensive Pulmonary:     Breath sounds: No stridor. No wheezing or rales.  Chest:     Chest wall: No tenderness.  Abdominal:     General: There is no distension.     Tenderness: There is no abdominal  tenderness. There is no rebound.  Musculoskeletal:        General: Normal range of motion.     Cervical back: Neck supple.     Comments: Not moving any extremities  Lymphadenopathy:     Cervical: No cervical adenopathy.  Skin:    Findings: No erythema or rash.  Neurological:     Motor: No abnormal muscle tone.     Coordination: Coordination normal.     Comments: Patient not responding to verbal or painful stimuli     ED Results / Procedures / Treatments   Labs (all labs ordered are listed, but only abnormal results are displayed) Labs Reviewed  CBG MONITORING, ED - Abnormal; Notable for the following components:      Result Value   Glucose-Capillary 51 (*)    All other components within normal limits  RESP PANEL BY RT-PCR (RSV, FLU A&B, COVID)  RVPGX2    EKG None  Radiology No results found.  Procedures Procedures    Medications Ordered in ED Medications - No data to display  ED Course/ Medical Decision Making/ A&P  CRITICAL CARE Performed by: Bethann Berkshire Total critical care time: 35 minutes Critical care time was exclusive of separately billable procedures and treating other patients. Critical care was necessary to treat or prevent imminent or life-threatening deterioration. Critical care was time spent personally by me on the following activities: development of treatment plan with patient and/or surrogate as well as nursing, discussions with consultants, evaluation of patient's response to treatment, examination of patient, obtaining history from patient or surrogate, ordering and performing treatments and interventions, ordering and review of laboratory studies, ordering and review of radiographic studies, pulse oximetry and re-evaluation of patient's condition.                            Medical Decision Making Amount and/or Complexity of Data Reviewed ECG/medicine tests: ordered.  Risk Decision regarding hospitalization.  This patient presents to the  ED for concern of altered mental status and hypotension, this involves an extensive number of treatment options, and is a complaint that carries with it a high risk of complications and morbidity.  The differential diagnosis includes stroke, sepsis, MI   Co morbidities that complicate the patient evaluation  Dementia and stroke   Additional history obtained:  Additional history obtained from family External records from outside source obtained and reviewed including hospital records   Lab Tests:  I Ordered, and personally interpreted labs.  The pertinent results include: Glucose 51  Imaging Studies ordered:  No imaging  Cardiac Monitoring: / EKG:  The patient was maintained on a cardiac monitor.  I personally viewed and interpreted the cardiac monitored which showed an underlying rhythm of: Normal sinus rhythm   Consultations Obtained:  No consultant  Problem List / ED Course / Critical interventions / Medication management  Altered mental status and hypotension Oxygen initially Reevaluation of the patient after these medicines showed that the patient stayed the same I have reviewed the patients home medicines and have made adjustments as needed   Social Determinants of Health:  Patient from nursing home with history of stroke and dementia   Test / Admission - Considered:  None  Patient with respiratory depression and hypotension.  History of stroke and dementia.  Family has decided to make her comfort care.  She is getting comfort care in the emergency department        Final Clinical Impression(s) / ED Diagnoses Final diagnoses:  None    Rx / DC Orders ED Discharge Orders     None         Bethann Berkshire, MD 11/29/2022 1344

## 2022-11-03 DIAGNOSIS — Z880 Allergy status to penicillin: Secondary | ICD-10-CM | POA: Diagnosis not present

## 2022-11-03 DIAGNOSIS — I10 Essential (primary) hypertension: Secondary | ICD-10-CM | POA: Diagnosis present

## 2022-11-03 DIAGNOSIS — M1990 Unspecified osteoarthritis, unspecified site: Secondary | ICD-10-CM | POA: Diagnosis present

## 2022-11-03 DIAGNOSIS — Z9049 Acquired absence of other specified parts of digestive tract: Secondary | ICD-10-CM | POA: Diagnosis not present

## 2022-11-03 DIAGNOSIS — Z515 Encounter for palliative care: Secondary | ICD-10-CM | POA: Diagnosis not present

## 2022-11-03 DIAGNOSIS — F0394 Unspecified dementia, unspecified severity, with anxiety: Secondary | ICD-10-CM | POA: Diagnosis present

## 2022-11-03 DIAGNOSIS — Z79890 Hormone replacement therapy (postmenopausal): Secondary | ICD-10-CM | POA: Diagnosis not present

## 2022-11-03 DIAGNOSIS — Z86718 Personal history of other venous thrombosis and embolism: Secondary | ICD-10-CM | POA: Diagnosis not present

## 2022-11-03 DIAGNOSIS — G9341 Metabolic encephalopathy: Secondary | ICD-10-CM | POA: Diagnosis present

## 2022-11-03 DIAGNOSIS — E039 Hypothyroidism, unspecified: Secondary | ICD-10-CM | POA: Diagnosis present

## 2022-11-03 DIAGNOSIS — K219 Gastro-esophageal reflux disease without esophagitis: Secondary | ICD-10-CM | POA: Diagnosis present

## 2022-11-03 DIAGNOSIS — Z1152 Encounter for screening for COVID-19: Secondary | ICD-10-CM | POA: Diagnosis not present

## 2022-11-03 DIAGNOSIS — J9601 Acute respiratory failure with hypoxia: Secondary | ICD-10-CM | POA: Diagnosis present

## 2022-11-03 DIAGNOSIS — R001 Bradycardia, unspecified: Secondary | ICD-10-CM | POA: Diagnosis present

## 2022-11-03 DIAGNOSIS — I959 Hypotension, unspecified: Secondary | ICD-10-CM | POA: Diagnosis present

## 2022-11-03 DIAGNOSIS — Z79899 Other long term (current) drug therapy: Secondary | ICD-10-CM | POA: Diagnosis not present

## 2022-11-03 DIAGNOSIS — E785 Hyperlipidemia, unspecified: Secondary | ICD-10-CM | POA: Diagnosis present

## 2022-11-03 DIAGNOSIS — Z8673 Personal history of transient ischemic attack (TIA), and cerebral infarction without residual deficits: Secondary | ICD-10-CM | POA: Diagnosis not present

## 2022-11-03 DIAGNOSIS — Z791 Long term (current) use of non-steroidal anti-inflammatories (NSAID): Secondary | ICD-10-CM | POA: Diagnosis not present

## 2022-11-03 DIAGNOSIS — R0682 Tachypnea, not elsewhere classified: Secondary | ICD-10-CM | POA: Diagnosis present

## 2022-11-03 DIAGNOSIS — F32A Depression, unspecified: Secondary | ICD-10-CM | POA: Diagnosis present

## 2022-11-03 DIAGNOSIS — Z66 Do not resuscitate: Secondary | ICD-10-CM | POA: Diagnosis present

## 2022-11-03 DIAGNOSIS — R400 Somnolence: Secondary | ICD-10-CM

## 2022-11-03 DIAGNOSIS — Z7982 Long term (current) use of aspirin: Secondary | ICD-10-CM | POA: Diagnosis not present

## 2022-11-03 DIAGNOSIS — F0393 Unspecified dementia, unspecified severity, with mood disturbance: Secondary | ICD-10-CM | POA: Diagnosis present

## 2022-11-03 MED ORDER — HYDROMORPHONE HCL-NACL 50-0.9 MG/50ML-% IV SOLN: 0.0000 mg/h | INTRAVENOUS | Status: DC

## 2022-11-03 MED ORDER — ACETAMINOPHEN 325 MG PO TABS: 650.0000 mg | ORAL_TABLET | Freq: Four times a day (QID) | ORAL | Status: DC | PRN

## 2022-11-03 MED ORDER — HYDROMORPHONE HCL PF 10 MG/ML IJ SOLN
INTRAMUSCULAR | Status: AC
  Filled 2022-11-03: qty 5

## 2022-11-03 MED ORDER — POLYVINYL ALCOHOL 1.4 % OP SOLN: 1.0000 [drp] | Freq: Four times a day (QID) | OPHTHALMIC | Status: DC | PRN

## 2022-11-03 MED ORDER — ACETAMINOPHEN 650 MG RE SUPP: 650.0000 mg | Freq: Four times a day (QID) | RECTAL | Status: DC | PRN

## 2022-11-03 MED ORDER — HALOPERIDOL LACTATE 5 MG/ML IJ SOLN: 2.5000 mg | INTRAMUSCULAR | Status: DC | PRN

## 2022-11-03 MED ORDER — GLYCOPYRROLATE 0.2 MG/ML IJ SOLN: 0.2000 mg | INTRAMUSCULAR | Status: DC | PRN

## 2022-11-03 MED ORDER — SODIUM CHLORIDE 0.9 % IV SOLN
0.0000 mg/h | INTRAVENOUS | Status: DC
  Administered 2022-11-03: 1 mg/h via INTRAVENOUS
  Filled 2022-11-03: qty 5

## 2022-11-03 MED ORDER — HYDROMORPHONE BOLUS VIA INFUSION: 1.0000 mg | INTRAVENOUS | Status: DC | PRN

## 2022-11-03 MED ORDER — MIDAZOLAM HCL 2 MG/2ML IJ SOLN: 2.0000 mg | INTRAMUSCULAR | Status: DC | PRN

## 2022-11-03 MED ORDER — GLYCOPYRROLATE 1 MG PO TABS: 1.0000 mg | ORAL_TABLET | ORAL | Status: DC | PRN

## 2022-11-03 MED ORDER — ONDANSETRON HCL 4 MG/2ML IJ SOLN: 4.0000 mg | Freq: Four times a day (QID) | INTRAMUSCULAR | Status: DC | PRN

## 2022-11-03 MED ORDER — ONDANSETRON 4 MG PO TBDP: 4.0000 mg | ORAL_TABLET | Freq: Four times a day (QID) | ORAL | Status: DC | PRN

## 2022-11-03 MED ORDER — SODIUM CHLORIDE 0.9 % IV SOLN: INTRAVENOUS | Status: DC

## 2022-11-03 NOTE — TOC Progression Note (Signed)
Transition of Care [facility]) - Progression Note    Patient Details  Name: Amy Roth MRN: [id number] Date of Birth: 06/10/1934  [facility] CM/SW Contact  Leitha Bleak, RN Phone Number: 11/03/2022, 10:22 AM  Clinical Narrative:   [facility]. Poor prognosis. TOC following.     Barriers to Discharge: Continued Medical Work up

## 2022-11-03 NOTE — H&P (Signed)
History and Physical    Patient: Amy Roth WGN:[id number] DOB: 1933-09-25 DOA: 11/30/2022 DOS: the patient was seen and examined on 11/03/2022 PCP: Miki Kins, FNP  Patient coming from: SNF  Chief Complaint:  Chief Complaint  Patient presents with   Respiratory Distress   HPI: Amy Roth is an 89 y.o. female with medical history significant of anxiety/depression, gastroesophageal flux disease, history of arthritis, hypertension, hyperlipidemia, hypothyroidism and prior history of stroke; who presented to the emergency department via EMS from a SNF.  Patient was unable to provide a history due to being lethargic, history was obtained from daughter ED physician as well as ED medical record.  Per daughter-in-law, patient has been declining within the last month at the nursing facility, she was noted to be a little lethargic and unresponsive and family decided for patient to be taken to the ED for further evaluation and management.  ED Course: In the emergency department, she was tachypneic, bradycardic, BP was 47/33.  CBG was 51, influenza A, B, SARS coronavirus 2, RSV was negative. Patient was noted with agonal breaths on arrival.  ED physician spoke with the family regarding patient's condition and family decided to make patient to be comfort care only.  Comfort care orders was placed in the ED and hospitalist was asked to admit patient.    Review of Systems: Review of systems as noted in the HPI. All other systems reviewed and are negative.   Past Medical History:  Diagnosis Date   AKI (acute kidney injury) (HCC)    Anxiety    Arthritis    Arthritis    Coffee ground emesis    Complication of anesthesia    hard to wake up   Depression    DVT (deep venous thrombosis) (HCC)    Dysrhythmia    Edema    feet/legs   GERD (gastroesophageal reflux disease)    Heart murmur    History of stroke 04/28/2022   Hyperlipidemia    Hypertension    Hyponatremia     Hypothyroidism    N&V (nausea and vomiting) 04/28/2022   Palpitation    Stroke [facility])    facial   Past Surgical History:  Procedure Laterality Date   BELPHAROPTOSIS REPAIR     BREAST BIOPSY Left 2004   benign   CARDIAC CATHETERIZATION     CATARACT EXTRACTION W/PHACO Left 04/08/2015   Procedure: CATARACT EXTRACTION PHACO AND INTRAOCULAR LENS PLACEMENT (IOC);  Surgeon: Lockie Mola, MD;  Location: [facility] ORS;  Service: Ophthalmology;  Laterality: Left;  US01:11.3 AP15.9 CDE11.33 FLUID LOT# [id number]   CATARACT EXTRACTION W/PHACO Right 06/03/2015   Procedure: CATARACT EXTRACTION PHACO AND INTRAOCULAR LENS PLACEMENT (IOC);  Surgeon: Lockie Mola, MD;  Location: [facility] ORS;  Service: Ophthalmology;  Laterality: Right;  [place]  1:12.8 AP   19.3 CDE  13.23 casette lot #[id number]   CHOLECYSTECTOMY     ESOPHAGOGASTRODUODENOSCOPY (EGD) WITH PROPOFOL N/A 04/29/2022   Procedure: ESOPHAGOGASTRODUODENOSCOPY (EGD) WITH PROPOFOL;  Surgeon: Corbin Ade, MD;  Location: [facility];  Service: Endoscopy;  Laterality: N/A;   NECK SURGERY     TUBAL LIGATION      Social History:  reports that she has never smoked. She has never used smokeless tobacco. She reports that she does not drink alcohol and does not use drugs.   Allergies  Allergen Reactions   Nitrofurantoin Macrocrystal    Penicillins    Penicillins Other (See Comments)    Unknown    Family History  Problem Relation Age of Onset   Hypertension Father    Heart disease Father    Hypertension Mother    Heart disease Mother    Breast cancer Neg Hx      Prior to Admission medications   Medication Sig Start Date End Date Taking? Authorizing Provider  amLODipine (NORVASC) 5 MG tablet Take 0.5 tablets (2.5 mg total) by mouth daily. 04/29/22   Vassie Loll, MD  aspirin 81 MG tablet Take 81 mg by mouth daily.    [provider]  benazepril (LOTENSIN) 5 MG tablet Take 5 mg by mouth daily. 02/15/22   [provider]  cephALEXin (KEFLEX) 250 MG capsule Take 1 capsule (250 mg total) by mouth daily. 08/26/22   Miki Kins, FNP  docusate sodium (COLACE) 100 MG capsule Take 100 mg by mouth 2 (two) times daily as needed for mild constipation.    [provider]  levothyroxine (SYNTHROID) 75 MCG tablet Take 1 tablet (75 mcg total) by mouth daily before breakfast. 10/24/20   Elgergawy, Leana Roe, MD  meloxicam (MOBIC) 15 MG tablet Take 1 tablet (15 mg total) by mouth daily. 05/03/22   Vassie Loll, MD  metoprolol succinate (TOPROL-XL) 25 MG 24 hr tablet Take 25 mg by mouth daily.    [provider]  pantoprazole (PROTONIX) 40 MG tablet Take 1 tablet (40 mg total) by mouth 2 (two) times daily. After one month, can take medication once a day only. 04/29/22   Vassie Loll, MD  PARoxetine (PAXIL) 30 MG tablet Take 30 mg by mouth daily. 03/23/22   [provider]  QUEtiapine (SEROQUEL) 50 MG tablet Take 1 tablet (50 mg total) by mouth at bedtime. 10/04/22   Lonell Grandchild, MD  rOPINIRole (REQUIP) 0.25 MG tablet Take 1 tablet (0.25 mg total) by mouth at bedtime. 09/19/22   Miki Kins, FNP  simvastatin (ZOCOR) 20 MG tablet Take 20 mg by mouth at bedtime.    [provider]  Vibegron (GEMTESA) 75 MG TABS Take 1 tablet (75 mg total) by mouth daily. 09/19/22   Miki Kins, FNP  Vitamin D, Ergocalciferol, (DRISDOL) 1.25 MG (50000 UNIT) CAPS capsule Take 1 capsule by mouth once a week. 08/10/20   [provider]    Physical Exam: BP (!) 52/36   Pulse (!) 57   Temp 98 F (36.7 C) (Rectal)   Resp 20   Ht 5\' 1"  (1.549 m)   Wt 65 kg   SpO2 100%   BMI 27.08 kg/m   General: 89 y.o. year-old female lethargic, difficult to arouse, but in no acute distress.  HEENT: NCAT, EOMI Neck: Supple, trachea medial Cardiovascular: Regular rate and rhythm with no rubs or gallops.  No thyromegaly or JVD noted.  No lower extremity edema. 2/4 pulses in all 4  extremities. Respiratory: Decreased breath sounds with occasional agonal breaths. Abdomen: Soft, nontender nondistended with normal bowel sounds x4 quadrants. Muskuloskeletal: No cyanosis, clubbing or edema noted bilaterally Neuro: This cannot be assessed at this time due to patient's current condition Nancyi skin: No ulcerative lesions noted or rashes Psychiatry: This cannot be evaluated at this time due to patient's current condition         Labs on Admission:  Basic Metabolic Panel: No results for input(s): "NA", "K", "CL", "CO2", "GLUCOSE", "BUN", "CREATININE", "CALCIUM", "MG", "PHOS" in the last 168 hours. Liver Function Tests: No results for input(s): "AST", "ALT", "ALKPHOS", "BILITOT", "PROT", "ALBUMIN" in the last 168 hours. No  results for input(s): "LIPASE", "AMYLASE" in the last 168 hours. No results for input(s): "AMMONIA" in the last 168 hours. CBC: No results for input(s): "WBC", "NEUTROABS", "HGB", "HCT", "MCV", "PLT" in the last 168 hours. Cardiac Enzymes: No results for input(s): "CKTOTAL", "CKMB", "CKMBINDEX", "TROPONINI" in the last 168 hours.  BNP (last 3 results) No results for input(s): "BNP" in the last 8760 hours.  ProBNP (last 3 results) No results for input(s): "PROBNP" in the last 8760 hours.  CBG: Recent Labs  Lab 27-Nov-2022 2238  GLUCAP 51*    Radiological Exams on Admission: No results found.  EKG: I independently viewed the EKG done and my findings are as followed: Sinus bradycardia at a rate of 54 bpm  Assessment/Plan Present on Admission:  Acute respiratory failure with hypoxia (HCC)  Altered mental status  Principal Problem:   Acute respiratory failure with hypoxia (HCC) Active Problems:   Altered mental status   Acute respiratory failure with hypoxia Altered mental status Patient was changed to comfort care Comfort care orders placed Palliative care was consulted for end-of-life care/support.   DVT prophylaxis: None   Advance  Care Planning:   Code Status: DNR   Consults: Palliative care   Family Communication: Daughter at bedside (all questions answered to satisfaction)  Severity of Illness: The appropriate patient status for this patient is INPATIENT. Inpatient status is judged to be reasonable and necessary in order to provide the required intensity of service to ensure the patient's safety. The patient's presenting symptoms, physical exam findings, and initial radiographic and laboratory data in the context of their chronic comorbidities is felt to place them at high risk for further clinical deterioration. Furthermore, it is not anticipated that the patient will be medically stable for discharge from the hospital within 2 midnights of admission.   * I certify that at the point of admission it is my clinical judgment that the patient will require inpatient hospital care spanning beyond 2 midnights from the point of admission due to high intensity of service, high risk for further deterioration and high frequency of surveillance required.*  Author: Frankey Shown, DO 11/03/2022 5:32 AM  For on call review [email or web address].

## 2022-11-03 NOTE — Care Plan (Signed)
This 89 years old female with PMH significant for anxiety/depression, GERD, history of arthritis, hypertension, hyperlipidemia, hypothyroidism, prior history of stroke, who was sent from his skilled nursing facility for altered mental status.  Patient was found to be lethargic.  Family report patient has been declining in the last month and she was unresponsive and lethargic.  Patient was evaluated in the ED remains bradycardic hypotensive and tachypneic.  Patient was noted to be with agonal breathing on arrival.  Family decided to make patient comfort care.  Comfort care orders placed.  Patient is lying comfortably daughter at bedside.

## 2022-11-14 ENCOUNTER — Other Ambulatory Visit: Payer: Self-pay | Admitting: Family

## 2022-11-24 ENCOUNTER — Ambulatory Visit: Payer: Medicare Other | Admitting: Family

## 2022-12-02 NOTE — Death Summary Note (Signed)
Death Summary  Amy Roth ZOX:[id number] DOB: 12-Aug-1933 DOA: 11-05-2022  PCP: Miki Kins, FNP  Admit date: Nov 05, 2022 Date of Death: Nov 11, 2022  Time of Death: 1: 28 am Notification: Miki Kins, FNP notified of death of 11-Nov-2022  History of present illness:  Amy Roth is a 89 y.o. female with past medical history significant for anxiety/depression, GERD, history of arthritis, hypertension, hyperlipidemia, hypothyroidism, prior history of stroke, who was sent from his skilled nursing facility for altered mental status. Patient was found to be lethargic. Family report patient has been declining in the last month and she was unresponsive and lethargic. Patient was evaluated in the ED,  remains bradycardic hypotensive and tachypneic. Patient was noted to be with agonal breathing on arrival. Family decided to make patient comfort care. Comfort care orders placed. Patient is lying comfortably daughter at bedside. Passed away and pronounced dead on 2022/11/07 @ 1: 28 am.   Final Diagnoses:  1.   Bradycardia 2. Respiratory distress. 3. Acute metabolic encaphalopathy   The results of significant diagnostics from this hospitalization (including imaging, microbiology, ancillary and laboratory) are listed below for reference.    Significant Diagnostic Studies: No results found.  Microbiology: Recent Results (from the past 240 hour(s))  Resp panel by RT-PCR (RSV, Flu A&B, Covid) Anterior Nasal Swab     Status: None   Collection Time: 11/05/2022 10:25 PM   Specimen: Anterior Nasal Swab  Result Value Ref Range Status   SARS Coronavirus 2 by RT PCR NEGATIVE NEGATIVE Final    Comment: (NOTE) SARS-CoV-2 target nucleic acids are NOT DETECTED.  The SARS-CoV-2 RNA is generally detectable in upper respiratory specimens during the acute phase of infection. The lowest concentration of SARS-CoV-2 viral copies this assay can detect is 138 copies/mL. A negative result does not  preclude SARS-Cov-2 infection and should not be used as the sole basis for treatment or other patient management decisions. A negative result may occur with  improper specimen collection/handling, submission of specimen other than nasopharyngeal swab, presence of viral mutation(s) within the areas targeted by this assay, and inadequate number of viral copies(<138 copies/mL). A negative result must be combined with clinical observations, patient history, and epidemiological information. The expected result is Negative.  Fact Sheet for Patients:  BloggerCourse.com  Fact Sheet for Healthcare Providers:  SeriousBroker.it  This test is no t yet approved or cleared by the [place] FDA and  has been authorized for detection and/or diagnosis of SARS-CoV-2 by FDA under an Emergency Use Authorization (EUA). This EUA will remain  in effect (meaning this test can be used) for the duration of the COVID-19 declaration under Section 564(b)(1) of the Act, 21 U.S.C.section 360bbb-3(b)(1), unless the authorization is terminated  or revoked sooner.       Influenza A by PCR NEGATIVE NEGATIVE Final   Influenza B by PCR NEGATIVE NEGATIVE Final    Comment: (NOTE) The Xpert Xpress SARS-CoV-2/FLU/RSV plus assay is intended as an aid in the diagnosis of influenza from Nasopharyngeal swab specimens and should not be used as a sole basis for treatment. Nasal washings and aspirates are unacceptable for Xpert Xpress SARS-CoV-2/FLU/RSV testing.  Fact Sheet for Patients: BloggerCourse.com  Fact Sheet for Healthcare Providers: SeriousBroker.it  This test is not yet approved or cleared by the [place] FDA and has been authorized for detection and/or diagnosis of SARS-CoV-2 by FDA under an Emergency Use Authorization (EUA). This EUA will remain in effect (meaning this test can be used) for the  duration of the COVID-19 declaration under Section 564(b)(1) of the Act, 21 U.S.C. section 360bbb-3(b)(1), unless the authorization is terminated or revoked.     Resp Syncytial Virus by PCR NEGATIVE NEGATIVE Final    Comment: (NOTE) Fact Sheet for Patients: BloggerCourse.com  Fact Sheet for Healthcare Providers: SeriousBroker.it  This test is not yet approved or cleared by the [place] FDA and has been authorized for detection and/or diagnosis of SARS-CoV-2 by FDA under an Emergency Use Authorization (EUA). This EUA will remain in effect (meaning this test can be used) for the duration of the COVID-19 declaration under Section 564(b)(1) of the Act, 21 U.S.C. section 360bbb-3(b)(1), unless the authorization is terminated or revoked.  Performed at [facility], [address] [place], [place] [address]      Labs: Basic Metabolic Panel: No results for input(s): "NA", "K", "CL", "CO2", "GLUCOSE", "BUN", "CREATININE", "CALCIUM", "MG", "PHOS" in the last 168 hours. Liver Function Tests: No results for input(s): "AST", "ALT", "ALKPHOS", "BILITOT", "PROT", "ALBUMIN" in the last 168 hours. No results for input(s): "LIPASE", "AMYLASE" in the last 168 hours. No results for input(s): "AMMONIA" in the last 168 hours. CBC: No results for input(s): "WBC", "NEUTROABS", "HGB", "HCT", "MCV", "PLT" in the last 168 hours. Cardiac Enzymes: No results for input(s): "CKTOTAL", "CKMB", "CKMBINDEX", "TROPONINI" in the last 168 hours. D-Dimer No results for input(s): "DDIMER" in the last 72 hours. BNP: Invalid input(s): "POCBNP" CBG: Recent Labs  Lab 11-29-22 2238  GLUCAP 51*   Anemia work up No results for input(s): "VITAMINB12", "FOLATE", "FERRITIN", "TIBC", "IRON", "RETICCTPCT" in the last 72 hours. Urinalysis    Component Value Date/Time   COLORURINE STRAW (A) 10/04/2022 2014   APPEARANCEUR CLEAR 10/04/2022 2014   APPEARANCEUR  Clear 10/04/2022 1619   LABSPEC 1.005 10/04/2022 2014   PHURINE 7.0 10/04/2022 2014   GLUCOSEU NEGATIVE 10/04/2022 2014   HGBUR MODERATE (A) 10/04/2022 2014   BILIRUBINUR NEGATIVE 10/04/2022 2014   BILIRUBINUR Negative 10/04/2022 1619   BILIRUBINUR 1+ 10/04/2022 1511   KETONESUR 5 (A) 10/04/2022 2014   PROTEINUR NEGATIVE 10/04/2022 2014   PROTEINUR Negative 10/04/2022 1619   PROTEINUR Positive (A) 10/04/2022 1511   UROBILINOGEN 0.2 10/04/2022 1511   NITRITE NEGATIVE 10/04/2022 2014   NITRITE Negative 10/04/2022 1619   NITRITE Negative 10/04/2022 1511   LEUKOCYTESUR TRACE (A) 10/04/2022 2014   LEUKOCYTESUR Negative 10/04/2022 1619   LEUKOCYTESUR Negative 10/04/2022 1511   Sepsis Labs No results for input(s): "WBC" in the last 168 hours.  Invalid input(s): "PROCALCITONIN", "LACTICIDVEN"     SIGNED:  Willeen Niece, MD  [facility] 11/08/2022, 7:01 PM Pager   If 7PM-7AM, please contact night-coverage [email or web address] Password [email or web address]

## 2022-12-02 NOTE — Progress Notes (Signed)
10 ml of Dilaudid wasted with Charge RN, [place] witnessed.

## 2022-12-02 DEATH — deceased
